# Patient Record
Sex: Male | Born: 1940 | Race: White | Hispanic: No | Marital: Married | Smoking: Former smoker
Health system: Southern US, Community
[De-identification: ages and names within clinical notes are randomized; demographics above are authoritative.]

## PROBLEM LIST (undated history)

## (undated) DIAGNOSIS — D649 Anemia, unspecified: Secondary | ICD-10-CM

## (undated) DIAGNOSIS — I10 Essential (primary) hypertension: Secondary | ICD-10-CM

## (undated) DIAGNOSIS — M1990 Unspecified osteoarthritis, unspecified site: Secondary | ICD-10-CM

## (undated) DIAGNOSIS — H269 Unspecified cataract: Secondary | ICD-10-CM

## (undated) DIAGNOSIS — R569 Unspecified convulsions: Secondary | ICD-10-CM

## (undated) DIAGNOSIS — E039 Hypothyroidism, unspecified: Secondary | ICD-10-CM

## (undated) DIAGNOSIS — G459 Transient cerebral ischemic attack, unspecified: Secondary | ICD-10-CM

## (undated) HISTORY — PX: BACK SURGERY: SHX140

## (undated) HISTORY — PX: CATARACT EXTRACTION: SUR2

---

## 2005-03-26 ENCOUNTER — Emergency Department: Payer: Self-pay | Admitting: Unknown Physician Specialty

## 2005-03-26 ENCOUNTER — Other Ambulatory Visit: Payer: Self-pay

## 2009-01-06 ENCOUNTER — Ambulatory Visit: Payer: Self-pay | Admitting: Unknown Physician Specialty

## 2009-01-16 ENCOUNTER — Ambulatory Visit: Payer: Self-pay | Admitting: Unknown Physician Specialty

## 2010-03-20 ENCOUNTER — Emergency Department: Payer: Self-pay | Admitting: Emergency Medicine

## 2010-06-05 ENCOUNTER — Emergency Department: Payer: Self-pay | Admitting: Emergency Medicine

## 2010-09-08 ENCOUNTER — Emergency Department: Payer: Self-pay | Admitting: Emergency Medicine

## 2011-01-24 ENCOUNTER — Observation Stay: Payer: Self-pay | Admitting: Family Medicine

## 2011-03-01 ENCOUNTER — Ambulatory Visit: Payer: Self-pay | Admitting: Oncology

## 2012-01-31 ENCOUNTER — Emergency Department: Payer: Self-pay | Admitting: Internal Medicine

## 2012-01-31 LAB — CBC WITH DIFFERENTIAL/PLATELET
Basophil #: 0.2 10*3/uL — ABNORMAL HIGH (ref 0.0–0.1)
Basophil %: 1.4 %
Lymphocyte %: 15.4 %
Monocyte %: 6.1 %
Platelet: 239 10*3/uL (ref 150–440)
RDW: 17.1 % — ABNORMAL HIGH (ref 11.5–14.5)
WBC: 12.2 10*3/uL — ABNORMAL HIGH (ref 3.8–10.6)

## 2012-01-31 LAB — URINALYSIS, COMPLETE
Bacteria: NONE SEEN
Bilirubin,UR: NEGATIVE
Blood: NEGATIVE
Glucose,UR: NEGATIVE mg/dL (ref 0–75)
Ketone: NEGATIVE
Leukocyte Esterase: NEGATIVE
Nitrite: NEGATIVE
Squamous Epithelial: NONE SEEN
WBC UR: NONE SEEN /HPF (ref 0–5)

## 2012-01-31 LAB — COMPREHENSIVE METABOLIC PANEL
Albumin: 3.7 g/dL (ref 3.4–5.0)
Alkaline Phosphatase: 81 U/L (ref 50–136)
BUN: 16 mg/dL (ref 7–18)
Bilirubin,Total: 0.5 mg/dL (ref 0.2–1.0)
Calcium, Total: 9.5 mg/dL (ref 8.5–10.1)
Co2: 27 mmol/L (ref 21–32)
EGFR (Non-African Amer.): 51 — ABNORMAL LOW
Glucose: 132 mg/dL — ABNORMAL HIGH (ref 65–99)
Osmolality: 273 (ref 275–301)
SGOT(AST): 11 U/L — ABNORMAL LOW (ref 15–37)
Sodium: 135 mmol/L — ABNORMAL LOW (ref 136–145)
Total Protein: 7.9 g/dL (ref 6.4–8.2)

## 2012-01-31 LAB — LIPASE, BLOOD: Lipase: 136 U/L (ref 73–393)

## 2012-10-21 ENCOUNTER — Emergency Department: Payer: Self-pay | Admitting: Emergency Medicine

## 2012-10-21 LAB — URINALYSIS, COMPLETE
Bacteria: NONE SEEN
Blood: NEGATIVE
Ketone: NEGATIVE
Leukocyte Esterase: NEGATIVE
Nitrite: NEGATIVE
Ph: 5 (ref 4.5–8.0)
Protein: NEGATIVE
RBC,UR: 1 /HPF (ref 0–5)
Specific Gravity: 1.014 (ref 1.003–1.030)
Squamous Epithelial: <1
WBC UR: <1 /HPF (ref 0–5)

## 2012-10-21 LAB — COMPREHENSIVE METABOLIC PANEL
Alkaline Phosphatase: 92 U/L (ref 50–136)
Anion Gap: 7 (ref 7–16)
BUN: 16 mg/dL (ref 7–18)
Bilirubin,Total: 0.4 mg/dL (ref 0.2–1.0)
Calcium, Total: 9.3 mg/dL (ref 8.5–10.1)
Chloride: 101 mmol/L (ref 98–107)
Co2: 25 mmol/L (ref 21–32)
Creatinine: 1.23 mg/dL (ref 0.60–1.30)
EGFR (Non-African Amer.): 59 — ABNORMAL LOW
Glucose: 117 mg/dL — ABNORMAL HIGH (ref 65–99)
Osmolality: 269 (ref 275–301)
SGOT(AST): 20 U/L (ref 15–37)
SGPT (ALT): 30 U/L (ref 12–78)
Sodium: 133 mmol/L — ABNORMAL LOW (ref 136–145)
Total Protein: 7.8 g/dL (ref 6.4–8.2)

## 2012-10-21 LAB — CBC
HCT: 35.7 % — ABNORMAL LOW (ref 40.0–52.0)
HGB: 12.5 g/dL — ABNORMAL LOW (ref 13.0–18.0)
MCH: 31.5 pg (ref 26.0–34.0)
MCV: 90 fL (ref 80–100)
RDW: 15.3 % — ABNORMAL HIGH (ref 11.5–14.5)
WBC: 11.9 10*3/uL — ABNORMAL HIGH (ref 3.8–10.6)

## 2012-10-27 ENCOUNTER — Ambulatory Visit: Payer: Self-pay | Admitting: Family Medicine

## 2013-11-07 ENCOUNTER — Emergency Department: Payer: Self-pay | Admitting: Emergency Medicine

## 2013-11-08 LAB — BASIC METABOLIC PANEL
Anion Gap: 6 — ABNORMAL LOW (ref 7–16)
BUN: 12 mg/dL (ref 7–18)
CALCIUM: 9.5 mg/dL (ref 8.5–10.1)
CHLORIDE: 91 mmol/L — AB (ref 98–107)
CO2: 31 mmol/L (ref 21–32)
Creatinine: 0.97 mg/dL (ref 0.60–1.30)
EGFR (African American): >60
EGFR (Non-African Amer.): >60
Glucose: 117 mg/dL — ABNORMAL HIGH (ref 65–99)
Osmolality: 258 (ref 275–301)
Potassium: 4.1 mmol/L (ref 3.5–5.1)
Sodium: 128 mmol/L — ABNORMAL LOW (ref 136–145)

## 2013-11-08 LAB — MAGNESIUM: MAGNESIUM: 0.8 mg/dL — AB

## 2014-03-29 ENCOUNTER — Ambulatory Visit: Payer: Self-pay

## 2014-03-30 ENCOUNTER — Observation Stay (HOSPITAL_COMMUNITY)
Admission: EM | Admit: 2014-03-30 | Discharge: 2014-04-01 | Disposition: A | Discharge status: Home / Self Care | Payer: 59 | Attending: Family Medicine | Admitting: Family Medicine

## 2014-03-30 ENCOUNTER — Emergency Department (HOSPITAL_COMMUNITY): Payer: 59

## 2014-03-30 ENCOUNTER — Encounter (HOSPITAL_COMMUNITY): Payer: Self-pay | Admitting: Emergency Medicine

## 2014-03-30 DIAGNOSIS — G459 Transient cerebral ischemic attack, unspecified: Principal | ICD-10-CM | POA: Diagnosis present

## 2014-03-30 DIAGNOSIS — E039 Hypothyroidism, unspecified: Secondary | ICD-10-CM | POA: Diagnosis not present

## 2014-03-30 DIAGNOSIS — R200 Anesthesia of skin: Secondary | ICD-10-CM | POA: Diagnosis present

## 2014-03-30 DIAGNOSIS — K219 Gastro-esophageal reflux disease without esophagitis: Secondary | ICD-10-CM | POA: Diagnosis present

## 2014-03-30 DIAGNOSIS — I10 Essential (primary) hypertension: Secondary | ICD-10-CM | POA: Diagnosis not present

## 2014-03-30 DIAGNOSIS — E871 Hypo-osmolality and hyponatremia: Secondary | ICD-10-CM | POA: Diagnosis not present

## 2014-03-30 DIAGNOSIS — Z8673 Personal history of transient ischemic attack (TIA), and cerebral infarction without residual deficits: Secondary | ICD-10-CM | POA: Diagnosis not present

## 2014-03-30 DIAGNOSIS — E785 Hyperlipidemia, unspecified: Secondary | ICD-10-CM | POA: Diagnosis not present

## 2014-03-30 DIAGNOSIS — G451 Carotid artery syndrome (hemispheric): Secondary | ICD-10-CM

## 2014-03-30 DIAGNOSIS — I639 Cerebral infarction, unspecified: Secondary | ICD-10-CM | POA: Insufficient documentation

## 2014-03-30 HISTORY — DX: Unspecified cataract: H26.9

## 2014-03-30 HISTORY — DX: Essential (primary) hypertension: I10

## 2014-03-30 HISTORY — DX: Unspecified osteoarthritis, unspecified site: M19.90

## 2014-03-30 HISTORY — DX: Hypothyroidism, unspecified: E03.9

## 2014-03-30 HISTORY — DX: Anemia, unspecified: D64.9

## 2014-03-30 HISTORY — DX: Transient cerebral ischemic attack, unspecified: G45.9

## 2014-03-30 LAB — COMPREHENSIVE METABOLIC PANEL
ALT: 38 U/L (ref 0–53)
AST: 38 U/L — AB (ref 0–37)
Albumin: 3.8 g/dL (ref 3.5–5.2)
Alkaline Phosphatase: 79 U/L (ref 39–117)
Anion gap: 10 (ref 5–15)
BUN: 15 mg/dL (ref 6–23)
CALCIUM: 9.6 mg/dL (ref 8.4–10.5)
CO2: 26 mEq/L (ref 19–32)
Chloride: 94 mEq/L — ABNORMAL LOW (ref 96–112)
Creatinine, Ser: 0.99 mg/dL (ref 0.50–1.35)
GFR calc Af Amer: >90 mL/min (ref >90)
GFR calc non Af Amer: 80 mL/min — ABNORMAL LOW (ref >90)
Glucose, Bld: 98 mg/dL (ref 70–99)
Potassium: 4.5 mEq/L (ref 3.7–5.3)
SODIUM: 130 meq/L — AB (ref 137–147)
TOTAL PROTEIN: 6.9 g/dL (ref 6.0–8.3)
Total Bilirubin: 0.2 mg/dL — ABNORMAL LOW (ref 0.3–1.2)

## 2014-03-30 LAB — CBC
HCT: 32.4 % — ABNORMAL LOW (ref 39.0–52.0)
Hemoglobin: 11.9 g/dL — ABNORMAL LOW (ref 13.0–17.0)
MCH: 31.4 pg (ref 26.0–34.0)
MCHC: 36.7 g/dL — ABNORMAL HIGH (ref 30.0–36.0)
MCV: 85.5 fL (ref 78.0–100.0)
PLATELETS: 264 10*3/uL (ref 150–400)
RBC: 3.79 MIL/uL — ABNORMAL LOW (ref 4.22–5.81)
RDW: 13.1 % (ref 11.5–15.5)
WBC: 5.5 10*3/uL (ref 4.0–10.5)

## 2014-03-30 LAB — DIFFERENTIAL
BASOS ABS: 0.1 10*3/uL (ref 0.0–0.1)
Basophils Relative: 1 % (ref 0–1)
EOS ABS: 0.2 10*3/uL (ref 0.0–0.7)
EOS PCT: 3 % (ref 0–5)
LYMPHS PCT: 27 % (ref 12–46)
Lymphs Abs: 1.5 10*3/uL (ref 0.7–4.0)
Monocytes Absolute: 0.5 10*3/uL (ref 0.1–1.0)
Monocytes Relative: 9 % (ref 3–12)
Neutro Abs: 3.3 10*3/uL (ref 1.7–7.7)
Neutrophils Relative %: 60 % (ref 43–77)

## 2014-03-30 LAB — URINALYSIS, ROUTINE W REFLEX MICROSCOPIC
Bilirubin Urine: NEGATIVE
Glucose, UA: NEGATIVE mg/dL
Hgb urine dipstick: NEGATIVE
KETONES UR: NEGATIVE mg/dL
Leukocytes, UA: NEGATIVE
NITRITE: NEGATIVE
PH: 6 (ref 5.0–8.0)
Protein, ur: NEGATIVE mg/dL
SPECIFIC GRAVITY, URINE: 1.012 (ref 1.005–1.030)
Urobilinogen, UA: 0.2 mg/dL (ref 0.0–1.0)

## 2014-03-30 LAB — I-STAT TROPONIN, ED: Troponin i, poc: 0.02 ng/mL (ref 0.00–0.08)

## 2014-03-30 LAB — APTT: aPTT: 31 seconds (ref 24–37)

## 2014-03-30 LAB — PROTIME-INR
INR: 1.03 (ref 0.00–1.49)
PROTHROMBIN TIME: 13.7 s (ref 11.6–15.2)

## 2014-03-30 MED ORDER — ACETAMINOPHEN 650 MG RE SUPP: 650.0000 mg | RECTAL | Status: DC | PRN

## 2014-03-30 MED ORDER — CLOPIDOGREL BISULFATE 75 MG PO TABS
75.0000 mg | ORAL_TABLET | Freq: Every day | ORAL | Status: DC
  Administered 2014-03-31 – 2014-04-01 (×2): 75 mg via ORAL
  Filled 2014-03-30 (×2): qty 1

## 2014-03-30 MED ORDER — ASPIRIN 81 MG PO CHEW
324.0000 mg | CHEWABLE_TABLET | Freq: Once | ORAL | Status: AC
  Administered 2014-03-30: 324 mg via ORAL
  Filled 2014-03-30: qty 4

## 2014-03-30 MED ORDER — STROKE: EARLY STAGES OF RECOVERY BOOK
Freq: Once | Status: AC
  Administered 2014-03-31: 05:00:00
  Filled 2014-03-30: qty 1

## 2014-03-30 MED ORDER — ACETAMINOPHEN 325 MG PO TABS: 650.0000 mg | ORAL_TABLET | ORAL | Status: DC | PRN

## 2014-03-30 MED ORDER — HEPARIN SODIUM (PORCINE) 5000 UNIT/ML IJ SOLN
5000.0000 [IU] | Freq: Three times a day (TID) | INTRAMUSCULAR | Status: DC
  Administered 2014-03-30 – 2014-04-01 (×6): 5000 [IU] via SUBCUTANEOUS
  Filled 2014-03-30 (×6): qty 1

## 2014-03-30 NOTE — ED Notes (Signed)
Pt taken to CT.

## 2014-03-30 NOTE — ED Notes (Signed)
Pt arrives via [facility] from home. States at 2010 pt began experiencing slurred speech, confusion and rt sided weakness. By the time EMS arrived all symptoms had resolved. Pt had confirmed TIA on Friday at [place]. Pt was started on ASA and Plavix.

## 2014-03-30 NOTE — ED Notes (Signed)
Dr. Ward at bedside.

## 2014-03-30 NOTE — H&P (Signed)
[facility] History and Physical  Gregory Weaver YQM:[id number] DOB: 04/10/1941 DOA: 03/30/2014  Referring physician: EDP PCP: No primary provider on file.   Chief Complaint: R sided weakness and aphasia   HPI: Gregory Weaver is a 73 y.o. male h/o HTN, hypothyroidism, prior TIA.  Patient presents to the ED with episode of L hand numbness yesterday that lasted for approximately 10 mins.  [facility] and was started on plavix which he took yesterday but has not taken today.  Just PTA he had an episode of R hand numbness, R facial droop, and aphasia that lasted for 30 mins and resolved prior to evaluation in the ED.  He is now at baseline and symptom free he states.  Denies headache or head injury.  Review of Systems: Systems reviewed.  As above, otherwise negative  Past Medical History  Diagnosis Date  . TIA (transient ischemic attack)   . Hypertension   . Hypothyroid   . Cataract   . Anemia   . Arthritis    Past Surgical History  Procedure Laterality Date  . Cataract extraction     Social History:  reports that he has quit smoking. He has never used smokeless tobacco. He reports that he does not drink alcohol or use illicit drugs.  Allergies  Allergen Reactions  . Codeine Nausea And Vomiting  . Guaifenesin & Derivatives     Chest pain    No family history on file.   Prior to Admission medications   Not on File   Physical Exam: Filed Vitals:   03/30/14 2315  BP: 168/58  Pulse: 64  Temp:   Resp: 18    BP 168/58  Pulse 64  Temp(Src) 98 F (36.7 C) (Oral)  Resp 18  Ht 5\' 7"  (1.702 m)  Wt 69.854 kg (154 lb)  BMI 24.11 kg/m2  SpO2 99%  General Appearance:    Alert, oriented, no distress, appears stated age  Head:    Normocephalic, atraumatic  Eyes:    PERRL, EOMI, sclera non-icteric        Nose:   Nares without drainage or epistaxis. Mucosa, turbinates normal  Throat:   Moist mucous membranes. Oropharynx  without erythema or exudate.  Neck:   Supple. No carotid bruits.  No thyromegaly.  No lymphadenopathy.   Back:     No CVA tenderness, no spinal tenderness  Lungs:     Clear to auscultation bilaterally, without wheezes, rhonchi or rales  Chest wall:    No tenderness to palpitation  Heart:    Regular rate and rhythm without murmurs, gallops, rubs  Abdomen:     Soft, non-tender, nondistended, normal bowel sounds, no organomegaly  Genitalia:    deferred  Rectal:    deferred  Extremities:   No clubbing, cyanosis or edema.  Pulses:   2+ and symmetric all extremities  Skin:   Skin color, texture, turgor normal, no rashes or lesions  Lymph nodes:   Cervical, supraclavicular, and axillary nodes normal  Neurologic:   CNII-XII intact. Normal strength, sensation and reflexes      throughout    Labs on Admission:  Basic Metabolic Panel:  Recent Labs Lab 03/30/14 2159  NA 130*  K 4.5  CL 94*  CO2 26  GLUCOSE 98  BUN 15  CREATININE 0.99  CALCIUM 9.6   Liver Function Tests:  Recent Labs Lab 03/30/14 2159  AST 38*  ALT 38  ALKPHOS 79  BILITOT  0.2*  PROT 6.9  ALBUMIN 3.8   No results found for this basename: LIPASE, AMYLASE,  in the last 168 hours No results found for this basename: AMMONIA,  in the last 168 hours CBC:  Recent Labs Lab 03/30/14 2159  WBC 5.5  NEUTROABS 3.3  HGB 11.9*  HCT 32.4*  MCV 85.5  PLT 264   Cardiac Enzymes: No results found for this basename: CKTOTAL, CKMB, CKMBINDEX, TROPONINI,  in the last 168 hours  BNP (last 3 results) No results found for this basename: PROBNP,  in the last 8760 hours CBG: No results found for this basename: GLUCAP,  in the last 168 hours  Radiological Exams on Admission: Ct Head Wo Contrast  03/30/2014   CLINICAL DATA:  Slurred speech, confusion, and right-sided weakness. Recent transient ischemic attack.  EXAM: CT HEAD WITHOUT CONTRAST  TECHNIQUE: Contiguous axial images were obtained from the base of the skull  through the vertex without intravenous contrast.  COMPARISON:  None.  FINDINGS: No mass lesion. No midline shift. No acute hemorrhage or hematoma. No extra-axial fluid collections. No evidence of acute infarction. There is slight periventricular white matter lucency consistent with chronic small vessel ischemic disease. Slight prominence of the lateral ventricles.  No osseous abnormality.  IMPRESSION: No acute abnormality. Minimal chronic small vessel ischemic disease.   Electronically Signed   By: Geanie CooleyJim  Maxwell M.D.   On: 03/30/2014 22:42    EKG: Independently reviewed.  Assessment/Plan Principal Problem:   TIA (transient ischemic attack)   1. TIA - 1. MRI/MRA brain 2. 2d echo, carotid duplex 3. A1C and lipid profile 4. Neuro consulted 5. Will keep patient on plavix (since his PCP started this) until neuro has a chance to evaluate him and make formal antiplatelet recs.  Got 1 dose of ASA 325 in ED today. 6. Tele monitor 2. HTN - BP currently running in the 160s-180s in the ED, asymptomatic currently but I hesitate to treat in setting of what sounds like acute TIA, monitor BPs and treat if SBP becomes higher than 200. 1. Long term will need to be started on treatment for HTN most likely (he says it usually runs around 140 at home).  Dr. Thad Rangereynolds called by EDP.  Code Status: Full Code  Family Communication: Daughter at bedside Disposition Plan: Admit to obs   Time spent: 70 min  Gregory Weaver M. [facility] Pager [phone]  If 7AM-7PM, please contact the day team taking care of the patient [email or web address] Password [email or web address] 03/30/2014, 11:40 PM

## 2014-03-30 NOTE — ED Provider Notes (Signed)
TIME SEEN: 9:40 PM  CHIEF COMPLAINT: Right hand numbness, right facial droop, aphasia  HPI: Patient is a 73 year old male with history of hypertension, hypothyroidism, prior TIA who presents to the emergency department with an episode of left hand numbness yesterday that lasted for approximately 10 minutes. He [facility] and was started on Plavix which he took yesterday but has not taken today. Just prior to arrival he had an episode of right hand numbness, right facial droop and aphasia that lasted for 30 minutes and resolved prior to me seeing the patient. He denies any headache or head injury. Denies any weakness. Denies any chest pain or shortness of breath. No palpitations.  ROS: See HPI Constitutional: no fever  Eyes: no drainage  ENT: no runny nose   Cardiovascular:  no chest pain  Resp: no SOB  GI: no vomiting GU: no dysuria Integumentary: no rash  Allergy: no hives  Musculoskeletal: no leg swelling  Neurological: no slurred speech ROS otherwise negative  PAST MEDICAL HISTORY/PAST SURGICAL HISTORY:  History reviewed. No pertinent past medical history.  MEDICATIONS:  Prior to Admission medications   Not on File    ALLERGIES:  Allergies not on file  SOCIAL HISTORY:  History  Substance Use Topics  . Smoking status: Not on file  . Smokeless tobacco: Not on file  . Alcohol Use: Not on file    FAMILY HISTORY: No family history on file.  EXAM: BP 183/58  Pulse 69  Temp(Src) 98 F (36.7 C) (Oral)  Resp 21  Ht 5\' 7"  (1.702 m)  Wt 154 lb (69.854 kg)  BMI 24.11 kg/m2  SpO2 100% CONSTITUTIONAL: Alert and oriented and responds appropriately to questions. Well-appearing; well-nourished HEAD: Normocephalic EYES: Conjunctivae clear, PERRL ENT: normal nose; no rhinorrhea; moist mucous membranes; pharynx without lesions noted NECK: Supple, no meningismus, no LAD  CARD: RRR; S1 and S2 appreciated; no murmurs, no  clicks, no rubs, no gallops RESP: Normal chest excursion without splinting or tachypnea; breath sounds clear and equal bilaterally; no wheezes, no rhonchi, no rales,  ABD/GI: Normal bowel sounds; non-distended; soft, non-tender, no rebound, no guarding BACK:  The back appears normal and is non-tender to palpation, there is no CVA tenderness EXT: Normal ROM in all joints; non-tender to palpation; no edema; normal capillary refill; no cyanosis    SKIN: Normal color for age and race; warm NEURO: Moves all extremities equally; strabismus, sensation to light touch intact diffusely, cranial nerves II through XII intact, and the NIH stroke scale is 0 PSYCH: The patient's mood and manner are appropriate. Grooming and personal hygiene are appropriate.  MEDICAL DECISION MAKING: Patient here with symptoms concerning for a TIA. He has NIH scale is 0. He is now his neurologic baseline. He had similar symptoms yesterday that resolved after 10 minutes it was started on Plavix but did not take his dose tonight. He reports he has had a prior TIA in the past and was on aspirin. He did not have a stroke workup yesterday as an outpatient. He is here today because he had increasing symptoms that lasted longer. We'll obtain today workup including labs, urine, head CT. Anticipate discussing case with neurology.  ED PROGRESS: Patient's workup has been unremarkable. EKG shows no arrhythmia. Head CT shows chronic small vessel ischemic disease. Will discuss with neuro hospitalist.   11:04 PM  Discussed with Dr. Thad Rangereynolds who agrees with medicine admission for TIA workup.  11:19 PM  Discussed with  Dr. Julian ReilGardner for admission to tele, obs.     EKG Interpretation  Date/Time:  Saturday March 30 2014 21:34:11 EDT Ventricular Rate:  67 PR Interval:  155 QRS Duration: 90 QT Interval:  381 QTC Calculation: 402 R Axis:   -31 Text Interpretation:  Sinus rhythm Left axis deviation Abnormal R-wave progression, early transition  Confirmed by Girl Schissler,  DO, Hibo Blasdell [phone]([id number]) on 03/30/2014 9:38:32 PM        Layla MawKristen N Keion Neels, DO 03/30/14 2319

## 2014-03-30 NOTE — ED Notes (Signed)
Hospitalist consult at bedside. 

## 2014-03-30 NOTE — ED Notes (Signed)
Dr Elesa MassedWard made aware of symptoms.

## 2014-03-31 ENCOUNTER — Observation Stay (HOSPITAL_COMMUNITY): Payer: 59

## 2014-03-31 DIAGNOSIS — G452 Multiple and bilateral precerebral artery syndromes: Secondary | ICD-10-CM

## 2014-03-31 DIAGNOSIS — G459 Transient cerebral ischemic attack, unspecified: Secondary | ICD-10-CM | POA: Diagnosis not present

## 2014-03-31 LAB — HEMOGLOBIN A1C
Hgb A1c MFr Bld: 5.6 % (ref <5.7)
MEAN PLASMA GLUCOSE: 114 mg/dL (ref <117)

## 2014-03-31 LAB — LIPID PANEL
Cholesterol: 161 mg/dL (ref 0–200)
HDL: 34 mg/dL — ABNORMAL LOW (ref >39)
LDL Cholesterol: 99 mg/dL (ref 0–99)
Total CHOL/HDL Ratio: 4.7 RATIO
Triglycerides: 141 mg/dL (ref <150)
VLDL: 28 mg/dL (ref 0–40)

## 2014-03-31 MED ORDER — ATORVASTATIN CALCIUM 10 MG PO TABS
20.0000 mg | ORAL_TABLET | Freq: Every day | ORAL | Status: DC
  Filled 2014-03-31: qty 2

## 2014-03-31 MED ORDER — FLUTICASONE PROPIONATE 50 MCG/ACT NA SUSP
2.0000 | Freq: Every day | NASAL | Status: DC | PRN
  Administered 2014-03-31: 2 via NASAL
  Filled 2014-03-31 (×2): qty 16

## 2014-03-31 MED ORDER — LORAZEPAM 2 MG/ML IJ SOLN
1.0000 mg | Freq: Once | INTRAMUSCULAR | Status: AC | PRN
  Administered 2014-03-31: 1 mg via INTRAVENOUS
  Filled 2014-03-31: qty 1

## 2014-03-31 NOTE — Progress Notes (Addendum)
SLP Cancellation Note  Patient Details Name: Gregory Weaver MRN: [id number] DOB: [birth year]   Cancelled treatment:         Pt. Getting ready to leave for MRI.  Will return at first available chance for speech-language-cognitive eval.  [name], [name] 03/31/2014, 11:40 AM  Pager [phone]

## 2014-03-31 NOTE — Progress Notes (Signed)
[facility] PROGRESS NOTE  Balen Woolum ZOX:[id number] DOB: [birth year] DOA: 03/30/2014 PCP: No primary care provider on file.  Assessment/Plan: Principal Problem:   TIA (transient ischemic attack) - Neurology assisting with case - currently undergoing routine work up - MR of brain reported as negative for acute infarct, MRI negative for cerebral aneurysm - CT of head: no acute abnormality reported.  Code Status: full Family Communication: discussed with wife and patient at bedside Disposition Plan: pending workup   Consultants:  neurology  Procedures:  As listed above  Echocardiogram pending  Carotid Dopplers  Antibiotics:  None  HPI/Subjective: Pt has no new complaints. No acute issues overnight.  Objective: Filed Vitals:   03/31/14 1345  BP: 175/53  Pulse: 72  Temp: 97.8 F (36.6 C)  Resp: 20    Intake/Output Summary (Last 24 hours) at 03/31/14 1428 Last data filed at 03/31/14 0914  Gross per 24 hour  Intake    360 ml  Output      0 ml  Net    360 ml   Filed Weights   03/30/14 2136  Weight: 69.854 kg (154 lb)    Exam:   General:  Pt in nad, alert and awake  Cardiovascular: rrr, no mrg  Respiratory: cta bl, no wheezes  Abdomen: soft, NT, ND  Musculoskeletal: no cyanosis or clubbing   Data Reviewed: Basic Metabolic Panel:  Recent Labs Lab 03/30/14 2159  NA 130*  K 4.5  CL 94*  CO2 26  GLUCOSE 98  BUN 15  CREATININE 0.99  CALCIUM 9.6   Liver Function Tests:  Recent Labs Lab 03/30/14 2159  AST 38*  ALT 38  ALKPHOS 79  BILITOT 0.2*  PROT 6.9  ALBUMIN 3.8   No results for input(s): LIPASE, AMYLASE in the last 168 hours. No results for input(s): AMMONIA in the last 168 hours. CBC:  Recent Labs Lab 03/30/14 2159  WBC 5.5  NEUTROABS 3.3  HGB 11.9*  HCT 32.4*  MCV 85.5  PLT 264   Cardiac Enzymes: No results for input(s): CKTOTAL, CKMB, CKMBINDEX, TROPONINI in the last 168 hours. BNP (last 3  results) No results for input(s): PROBNP in the last 8760 hours. CBG: No results for input(s): GLUCAP in the last 168 hours.  No results found for this or any previous visit (from the past 240 hour(s)).   Studies: Ct Head Wo Contrast  03/30/2014   CLINICAL DATA:  Slurred speech, confusion, and right-sided weakness. Recent transient ischemic attack.  EXAM: CT HEAD WITHOUT CONTRAST  TECHNIQUE: Contiguous axial images were obtained from the base of the skull through the vertex without intravenous contrast.  COMPARISON:  None.  FINDINGS: No mass lesion. No midline shift. No acute hemorrhage or hematoma. No extra-axial fluid collections. No evidence of acute infarction. There is slight periventricular white matter lucency consistent with chronic small vessel ischemic disease. Slight prominence of the lateral ventricles.  No osseous abnormality.  IMPRESSION: No acute abnormality. Minimal chronic small vessel ischemic disease.   Electronically Signed   By: Geanie Cooley M.D.   On: 03/30/2014 22:42   Mr Brain Wo Contrast  03/31/2014   CLINICAL DATA:  Stroke. Left hand numbness has resolved. Also slurred speech.  EXAM: MRI HEAD WITHOUT CONTRAST  MRA HEAD WITHOUT CONTRAST  TECHNIQUE: Multiplanar, multiecho pulse sequences of the brain and surrounding structures were obtained without intravenous contrast. Angiographic images of the head were obtained using MRA technique without contrast.  COMPARISON:  CT head 03/30/2014  FINDINGS: MRI  HEAD FINDINGS  Mild atrophy. Ventricle size is mildly enlarged consistent with the level of atrophy.  Os odontoideum versus chronic fracture of the dens with posterior displacement of the dens by approximately 10 mm. This is causing impingement of the spinal cord at the craniocervical junction. 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 Further evaluation with cervical MRI and correlation with any prior history is suggested. No acute bone marrow edema is present and this appears chronic.  Negative  for acute infarct.  Chronic microvascular ischemic changes in the white matter. 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  Chronic microhemorrhage right brachium pontis. Cluster of chronic micro hemorrhage in the right parietal lobe.  Negative for mass or edema.  No shift of the midline structures.  Paranasal sinuses are clear.  MRA HEAD FINDINGS  Both vertebral arteries are patent to the basilar without significant stenosis. PICA patent bilaterally. Basilar widely patent. AICA, superior cerebellar, posterior cerebral arteries are normal  Internal carotid artery normal bilaterally. Variant anterior cerebral artery anatomy. 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  Moderate stenosis of the right M1 segment. Right middle cerebral artery branches are patent. Left middle cerebral artery widely patent  Negative for cerebral aneurysm.  IMPRESSION: Negative for acute infarct.  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 MRI cervical spine recommended for further evaluation as well as correlation with any history of fracture or prior imaging studies.  Moderate stenosis right M1 segment. No other significant intracranial stenosis.   Electronically Signed   By: Marlan Palauharles  Clark M.D.   On: 03/31/2014 13:59   Mr Maxine GlennMra Head/brain Wo Cm  03/31/2014   CLINICAL DATA:  Stroke. Left hand numbness has resolved. Also slurred speech.  EXAM: MRI HEAD WITHOUT CONTRAST  MRA HEAD WITHOUT CONTRAST  TECHNIQUE: Multiplanar, multiecho pulse sequences of the brain and surrounding structures were obtained without intravenous contrast. Angiographic images of the head were obtained using MRA technique without contrast.  COMPARISON:  CT head 03/30/2014  FINDINGS: MRI HEAD FINDINGS  Mild atrophy. Ventricle size is mildly enlarged consistent with the level of  atrophy.  Os odontoideum versus chronic fracture of the dens with posterior displacement of the dens by approximately 10 mm. This is causing impingement of the spinal cord at the craniocervical junction. 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 Further evaluation with cervical MRI and correlation with any prior history is suggested. No acute bone marrow edema is present and this appears chronic.  Negative for acute infarct.  Chronic microvascular ischemic changes in the white matter. 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  Chronic microhemorrhage right brachium pontis. Cluster of chronic micro hemorrhage in the right parietal lobe.  Negative for mass or edema.  No shift of the midline structures.  Paranasal sinuses are clear.  MRA HEAD FINDINGS  Both vertebral arteries are patent to the basilar without significant stenosis. PICA patent bilaterally. Basilar widely patent. AICA, superior cerebellar, posterior cerebral arteries are normal  Internal carotid artery normal bilaterally. Variant anterior cerebral artery anatomy. 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  Moderate stenosis of the right M1 segment. Right middle cerebral artery branches are patent. Left middle cerebral artery widely patent  Negative for cerebral aneurysm.  IMPRESSION: Negative for acute infarct.  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 MRI cervical spine recommended for further evaluation as well as correlation with any history of fracture or prior imaging studies.  Moderate stenosis right M1 segment. No other significant intracranial stenosis.   Electronically Signed   By: Marlan Palauharles  Clark M.D.   On: 03/31/2014 13:59    Scheduled Meds: . clopidogrel  75 mg Oral Daily  . heparin  5,000 Units Subcutaneous 3 times per day   Continuous Infusions:    Time spent: > 35 minutes    Penny PiaVEGA, Azhar Knope  [facility] Pager [phone] If 7PM-7AM, please contact night-coverage at [email or web address], password [email or web address] 03/31/2014, 2:28 PM  LOS: 1 day

## 2014-03-31 NOTE — Evaluation (Signed)
Physical Therapy Evaluation Patient Details Name: Nani GasserWalter Ray Soots MRN: [id number] DOB: 07/25/1940 Today's Date: 03/31/2014   History of Present Illness  Patient is a 73 yo male admitted 03/30/14 with RUE weakness and aphasia to r/o CVA/TIA.  PMH: HTN, TIA, anemia, arthritis, h/o back injury with Lt drop foot  Clinical Impression  Patient presents with problems listed below.  Patient medicated for MRI today and remains "groggy" from meds, impacting balance and mobility.  Patient will benefit from acute PT to address balance/mobility prior to return home with wife.    Follow Up Recommendations No PT follow up;Supervision/Assistance - 24 hour    Equipment Recommendations  None recommended by PT    Recommendations for Other Services       Precautions / Restrictions Precautions Precautions: Fall Precaution Comments: Lt foot drop.  Patient wears shoes any time up. Restrictions Weight Bearing Restrictions: No      Mobility  Bed Mobility Overal bed mobility: Modified Independent             General bed mobility comments: Use of bedrail to move to sitting.  Transfers Overall transfer level: Needs assistance Equipment used: None Transfers: Sit to/from Stand Sit to Stand: Min assist         General transfer comment: Verbal cues for technique.  Assist for balance during transfer and once standing.  Unsteady.  Patient reports feeling " 'lightheaded' from medicine"  Ambulation/Gait Ambulation/Gait assistance: Min assist Ambulation Distance (Feet): 120 Feet Assistive device: None Gait Pattern/deviations: Step-through pattern;Decreased step length - right;Decreased step length - left;Decreased dorsiflexion - left;Steppage;Decreased weight shift to left;Staggering left;Staggering right Gait velocity: Decreased Gait velocity interpretation: Below normal speed for age/gender General Gait Details: Patient with steppage gait due to Lt drop foot.  Decreased balance with gait,  requiring assist for balance x2.  Encouraged patient to slow gait speed for balance/safety.  Stairs            Wheelchair Mobility    Modified Rankin (Stroke Patients Only) Modified Rankin (Stroke Patients Only) Pre-Morbid Rankin Score: Slight disability Modified Rankin: Moderately severe disability (Balance decreased possibly due to medication)     Balance Overall balance assessment: Needs assistance Sitting-balance support: No upper extremity supported;Feet supported Sitting balance-Leahy Scale: Good Sitting balance - Comments: Patient able to lean forward and then bring LE's up to don shoes while maintaining balance.  While sitting EOB answering questions, patient began leaning posteriorly (groggy and losing balance). Postural control: Posterior lean Standing balance support: No upper extremity supported Standing balance-Leahy Scale: Fair Standing balance comment: Able to maintain static balance.  Slightly unsteady with dynamic activities.                             Pertinent Vitals/Pain Pain Assessment: No/denies pain    Home Living Family/patient expects to be discharged to:: Private residence Living Arrangements: Spouse/significant other Available Help at Discharge: Family;Available 24 hours/day Type of Home: House Home Access: Stairs to enter Entrance Stairs-Rails: Doctor, general practiceight;Left Entrance Stairs-Number of Steps: 6 Home Layout: One level Home Equipment: Electric scooter (Forearm crutches)      Prior Function Level of Independence: Independent with assistive device(s);Needs assistance   Gait / Transfers Assistance Needed: Has assist on stairs.  Uses scooter for longer distances outside of house.           Hand Dominance        Extremity/Trunk Assessment   Upper Extremity Assessment: Overall WFL for tasks assessed  Lower Extremity Assessment: Overall WFL for tasks assessed;LLE deficits/detail   LLE Deficits / Details: Noted LLE  shorter than RLE, and noted decreased dorsiflexion at 1/5.  Unable to passively move Lt ankle to neutral (drop foot).     Communication   Communication: Expressive difficulties (Slurred speech noted - pt and wife report this is baseline)  Cognition Arousal/Alertness: Lethargic;Suspect due to medications (Ativan for MRI) Behavior During Therapy: WFL for tasks assessed/performed Overall Cognitive Status: Within Functional Limits for tasks assessed                      General Comments      Exercises        Assessment/Plan    PT Assessment Patient needs continued PT services  PT Diagnosis Abnormality of gait   PT Problem List Decreased strength;Decreased range of motion;Decreased activity tolerance;Decreased balance;Decreased mobility  PT Treatment Interventions DME instruction;Gait training;Stair training;Functional mobility training;Therapeutic activities;Therapeutic exercise;Balance training;Patient/family education   PT Goals (Current goals can be found in the Care Plan section) Acute Rehab PT Goals Patient Stated Goal: To go home PT Goal Formulation: With patient/family Time For Goal Achievement: 04/07/14 Potential to Achieve Goals: Good    Frequency Min 3X/week   Barriers to discharge        Co-evaluation               End of Session Equipment Utilized During Treatment: Gait belt Activity Tolerance: Patient tolerated treatment well Patient left: in bed;with call bell/phone within reach;with family/visitor present Nurse Communication: Mobility status    Functional Assessment Tool Used: Clinical judgement Functional Limitation: Mobility: Walking and moving around Mobility: Walking and Moving Around Current Status [phone]([id number]): At least 1 percent but less than 20 percent impaired, limited or restricted Mobility: Walking and Moving Around Goal Status [phone]([id number]): At least 1 percent but less than 20 percent impaired, limited or restricted    Time: 1456-1519 PT  Time Calculation (min): 23 min   Charges:   PT Evaluation $Initial PT Evaluation Tier I: 1 Procedure PT Treatments $Gait Training: 8-22 mins   PT G Codes:   Functional Assessment Tool Used: Clinical judgement Functional Limitation: Mobility: Walking and moving around    Vena AustriaDavis, Joshue Badal H 03/31/2014, 4:00 PM Durenda HurtSusan H. Renaldo Fiddleravis, PT, [facility] Center[facility] Acute Rehab Services Pager [phone]

## 2014-03-31 NOTE — Progress Notes (Signed)
Utilization review completed.  

## 2014-03-31 NOTE — Progress Notes (Signed)
Speech Language Pathology  Patient Details Name: Gregory Weaver MRN: [id number] DOB: 27-May-1941 Today's Date: 03/31/2014 Time:  -    PT reported pt. sleepy following sedated MRI today.  Pt. would benefit from defer speech-language-cognition assessment when more alert for best results. Will be completed at first available opportunity.  Breck CoonsLisa Willis Mount OliveLitaker M.Ed [employer]CCC-SLP Pager [phone]

## 2014-03-31 NOTE — Progress Notes (Signed)
STROKE TEAM PROGRESS NOTE   HISTORY Gregory Weaver is an 73 y.o. male who reports that he was in his usual state o health until 1230 on Friday. At that time he had a 10 minute episode of left hand numbness. This resolved spontaneously. Then on Saturday the patient had an episode of slurred speech, right hand numbness and right facial droop with slurring that lasted about 30 minutes. Patient presented for evaluation at that time. Patient is currently at baseline. Prior to these events the patient was on 162mg  of ASA daily. He saw his PCP on Friday and Plavix was added.  Patient had a previous TIA many years ago.   Date last known well: Date: 03/29/2014 Time last known well: Time: 18:00 tPA Given: No: Resolution of symptoms   SUBJECTIVE (INTERVAL HISTORY) The patient seems to me to have mild dysarthria. The daughter reports that he was given Ativan for the MRI and still has effects from this. The patient reports that he is doing well and has not had any recurrent symptoms. He did have 2 episodes of unusual numbness. The initial episode occurred about 2 days ago involving the left upper extremity strengthening for the neck and radiating to the hand. Last about 10 minutes. He had the second event the following day but this time involving the right side and associated with significant dysarthria. That event lasted for about 30 minutes. Patient complains of having congestion which apparently has not been responsive to Flonase. He also reports having left thoracic congestion. This will be related to the hospitalist.   OBJECTIVE Temp:  [98 F (36.7 C)] 98 F (36.7 C) (10/31 2136) Pulse Rate:  [63-86] 73 (11/01 0811) Cardiac Rhythm:  [-] Normal sinus rhythm (11/01 0030) Resp:  [18-21] 20 (11/01 0811) BP: (152-185)/(50-75) 163/53 mmHg (11/01 0811) SpO2:  [98 %-100 %] 98 % (11/01 0811) Weight:  [154 lb (69.854 kg)] 154 lb (69.854 kg) (10/31 2136)  No results for input(s): GLUCAP in the  last 168 hours.  Recent Labs Lab 03/30/14 2159  NA 130*  K 4.5  CL 94*  CO2 26  GLUCOSE 98  BUN 15  CREATININE 0.99  CALCIUM 9.6    Recent Labs Lab 03/30/14 2159  AST 38*  ALT 38  ALKPHOS 79  BILITOT 0.2*  PROT 6.9  ALBUMIN 3.8    Recent Labs Lab 03/30/14 2159  WBC 5.5  NEUTROABS 3.3  HGB 11.9*  HCT 32.4*  MCV 85.5  PLT 264   No results for input(s): CKTOTAL, CKMB, CKMBINDEX, TROPONINI in the last 168 hours.  Recent Labs  03/30/14 2159  LABPROT 13.7  INR 1.03    Recent Labs  03/30/14 2213  COLORURINE YELLOW  LABSPEC 1.012  PHURINE 6.0  GLUCOSEU NEGATIVE  HGBUR NEGATIVE  BILIRUBINUR NEGATIVE  KETONESUR NEGATIVE  PROTEINUR NEGATIVE  UROBILINOGEN 0.2  NITRITE NEGATIVE  LEUKOCYTESUR NEGATIVE       Component Value Date/Time   CHOL 161 03/31/2014 0715   TRIG 141 03/31/2014 0715   HDL 34* 03/31/2014 0715   CHOLHDL 4.7 03/31/2014 0715   VLDL 28 03/31/2014 0715   LDLCALC 99 03/31/2014 0715   No results found for: HGBA1C No results found for: LABOPIA, COCAINSCRNUR, LABBENZ, AMPHETMU, THCU, LABBARB  No results for input(s): ETH in the last 168 hours.  Ct Head Wo Contrast 03/30/2014    No acute abnormality. Minimal chronic small vessel ischemic disease.     MRI / MRA 03/31/2014 Negative for acute infarct. Chronic fracture the  dens versus os odontoideum. 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  MRI cervical spine recommended for further evaluation as well as correlation with any history of fracture or prior imaging studies. Moderate stenosis right M1 segment. No other significant intracranial stenosis.   PHYSICAL EXAM GENERAL: Thin pleasant and in no acute distress.  HEENT: Supple. Atraumatic normocephalic.   ABDOMEN: soft  EXTREMITIES: No edema   BACK: Normal.  SKIN: Normal by inspection.    MENTAL STATUS: Alert and oriented. Speech- mild dysarthria; language and cognition are  generally intact. Judgment and insight normal.   CRANIAL NERVES: Pupils are equal, round and reactive to light and accommodation; extra ocular movements are full, there is no significant nystagmus; visual fields are full; upper and lower facial muscles are normal in strength and symmetric, there is no flattening of the nasolabial folds; tongue is midline; uvula is midline; shoulder elevation is normal.  MOTOR: Normal tone, bulk and strength; no pronator drift.  COORDINATION: Left finger to nose is normal, right finger to nose is normal, No rest tremor; no intention tremor; no postural tremor; no bradykinesia.   SENSATION: Normal to light touch.  ASSESSMENT/PLAN Mr. Gregory Gregory Weaver is a 73 y.o. male with history of  hypertension, hypothyroidism, and a prior TIA presenting with transient left hand numbness, right hand numbness, slurred speech, and facial droop. He did not receive IV t-PA as his deficits had resolved.  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  Carotid Doppler  pending  2D Echo  pending  LDL 99  HgbA1c pending  Subcutaneous heparin for VTE prophylaxis  Diet Heart diet with thin liquids  Activity as tolerated  aspirin 81 mg orally every day and clopidogrel 75 mg orally every day prior to admission, now on aspirin 325 mg orally every day and clopidogrel 75 mg orally every day ( Plavix was just started Friday by his primary care physician)  Patient counseled to be compliant with his antithrombotic medications  Risk factor education  Ongoing aggressive risk factor management  Resultant - resolution of deficits  Therapy recommendations:  pending  Disposition:  pending  Hypertension  Home meds: no antihypertensive medications prior to admission  Systolic blood pressure is high at times.  May need better blood pressure control although moderate stenosis of the right M1  segment  Hyperlipidemia  Home meds:  No lipid lowering medications prior to admission.  LDL 99 goal < 70  Add Lipitor 20 mg daily  Continue statin at discharge   Other Stroke Risk Factors Advanced age . Hx stroke/TIA   Other Active Problems  Mild anemia  Mild hyponatremia  Mildly elevated liver function tests  Other Pertinent History  Previous TIAs  Hospital day # 1 Delton Seeavid Rinehuls PA-C [facility] Pager [phone] 03/31/2014, 8:16 AM  The patient was seen and examined by me; notes, chart and tests reviewed and discussed with midlevel provider, other providers, patient, and family.      To contact Stroke Continuity provider, please refer to [email or web address]. After hours, contact General Neurology

## 2014-03-31 NOTE — Consult Note (Signed)
Referring Physician: Julian Reil    Chief Complaint: TIA  HPI: Gregory Weaver is an 73 y.o. male who reports that he was in his usual state o health until 1230 on Friday.  At that time he had a 10 minute episode of left hand numbness.  This resolved spontaneously.  Then on Saturday the patient had an episode of slurred speech, right hand numbness and right facial droop with slurring that lasted about 30 minutes.  Patient presented for evaluation at that time.  Patient is currently at baseline.  Prioe to these events the patient was on 162mg  of ASA daily.  He saw his PCP on Friday and Plavix was added.   Patient had a previous TIA many years ago.   Date last known well: Date: 03/29/2014 Time last known well: Time: 18:00 tPA Given: No: Resolution of symptoms  Past Medical History  Diagnosis Date  . TIA (transient ischemic attack)   . Hypertension   . Hypothyroid   . Cataract   . Anemia   . Arthritis     Past Surgical History  Procedure Laterality Date  . Cataract extraction      Family history: Father with CHF and stroke.  Died of old age.   Mother died of breast cancer.  Social History:  reports that he has quit smoking. He has never used smokeless tobacco. He reports that he does not drink alcohol or use illicit drugs.  Allergies:  Allergies  Allergen Reactions  . Codeine Nausea And Vomiting  . Guaifenesin & Derivatives     Chest pain    Medications:  I have reviewed the patient's current medications. Prior to Admission:  Prescriptions prior to admission  Medication Sig Dispense Refill Last Dose  . acetaminophen (TYLENOL) 500 MG tablet Take 500 mg by mouth every 6 (six) hours as needed for mild pain.   03/30/2014 at Unknown time  . aspirin EC 81 MG tablet Take 81 mg by mouth at bedtime.   03/30/2014 at Unknown time  . Cholecalciferol (VITAMIN D-3) 1000 UNITS CAPS Take 1 capsule by mouth daily.   03/30/2014 at Unknown time  . clopidogrel (PLAVIX) 75 MG tablet Take 75 mg  by mouth daily.   03/29/2014 at Unknown time  . ferrous sulfate 325 (65 FE) MG tablet Take 325 mg by mouth daily with lunch.   03/30/2014 at Unknown time  . fluticasone (FLONASE) 50 MCG/ACT nasal spray Place 2 sprays into both nostrils daily as needed for allergies or rhinitis.   03/30/2014 at Unknown time  . folic acid (FOLVITE) 1 MG tablet Take 1 mg by mouth daily.   03/30/2014 at Unknown time  . levothyroxine (SYNTHROID, LEVOTHROID) 137 MCG tablet Take 137 mcg by mouth daily before breakfast.   03/30/2014 at Unknown time  . methotrexate (RHEUMATREX) 2.5 MG tablet Take 10 mg by mouth once a week. Caution:Chemotherapy. Protect from light. Every Thursday   03/28/2014  . Omega-3 Fatty Acids (SALMON OIL-1000 PO) Take 2 capsules by mouth 2 (two) times daily.   03/30/2014 at Unknown time  . omeprazole (PRILOSEC) 20 MG capsule Take 20 mg by mouth daily.   03/30/2014 at Unknown time  . vitamin B-12 (CYANOCOBALAMIN) 1000 MCG tablet Take 1,000 mcg by mouth daily.   03/30/2014 at Unknown time   Scheduled: .  stroke: mapping our early stages of recovery book   Does not apply Once  . clopidogrel  75 mg Oral Daily  . heparin  5,000 Units Subcutaneous 3 times per day  ROS: History obtained from the patient  General ROS: negative for - chills, fatigue, fever, night sweats, weight gain or weight loss Psychological ROS: negative for - behavioral disorder, hallucinations, memory difficulties, mood swings or suicidal ideation Ophthalmic ROS: negative for - blurry vision, double vision, eye pain or loss of vision ENT ROS: negative for - epistaxis, nasal discharge, oral lesions, sore throat, tinnitus or vertigo Allergy and Immunology ROS: negative for - hives or itchy/watery eyes Hematological and Lymphatic ROS: negative for - bleeding problems, bruising or swollen lymph nodes Endocrine ROS: negative for - galactorrhea, hair pattern changes, polydipsia/polyuria or temperature intolerance Respiratory ROS:  negative for - cough, hemoptysis, shortness of breath or wheezing Cardiovascular ROS: negative for - chest pain, dyspnea on exertion, edema or irregular heartbeat Gastrointestinal ROS: negative for - abdominal pain, diarrhea, hematemesis, nausea/vomiting or stool incontinence Genito-Urinary ROS: negative for - dysuria, hematuria, incontinence or urinary frequency/urgency Musculoskeletal ROS: left foot drop Neurological ROS: as noted in HPI Dermatological ROS: negative for rash and skin lesion changes  Physical Examination: Blood pressure 152/75, pulse 84, temperature 98 F (36.7 C), temperature source Oral, resp. rate 20, height 5\' 7"  (1.702 m), weight 69.854 kg (154 lb), SpO2 100 %.  Neurologic Examination: Mental Status: Alert, oriented, thought content appropriate.  Speech fluent without evidence of aphasia.  Dysarthric.  Able to follow 3 step commands without difficulty. Cranial Nerves: II: Discs flat bilaterally; Visual fields grossly normal, pupils equal, round, reactive to light and accommodation III,IV, VI: ptosis not present, extra-ocular motions intact bilaterally V,VII: smile symmetric, facial light touch sensation normal bilaterally VIII: hearing normal bilaterally IX,X: gag reflex present XI: bilateral shoulder shrug XII: midline tongue extension Motor: Right : Upper extremity   5/5    Left:     Upper extremity   5/5 with foot drop  Lower extremity   5/5     Lower extremity   5/5 Tone and bulk:normal tone throughout; no atrophy noted Sensory: Pinprick and light touch intact throughout, bilaterally Deep Tendon Reflexes: 2+ and symmetric with absent AJ's bilaterally Plantars: Right: downgoing   Left: downgoing Cerebellar: normal finger-to-nose and normal heel-to-shin testing bilaterally Gait: Unable to test CV: pulses palpable throughout     Laboratory Studies:  Basic Metabolic Panel:  Recent Labs Lab 03/30/14 2159  NA 130*  K 4.5  CL 94*  CO2 26  GLUCOSE 98   BUN 15  CREATININE 0.99  CALCIUM 9.6    Liver Function Tests:  Recent Labs Lab 03/30/14 2159  AST 38*  ALT 38  ALKPHOS 79  BILITOT 0.2*  PROT 6.9  ALBUMIN 3.8   No results for input(s): LIPASE, AMYLASE in the last 168 hours. No results for input(s): AMMONIA in the last 168 hours.  CBC:  Recent Labs Lab 03/30/14 2159  WBC 5.5  NEUTROABS 3.3  HGB 11.9*  HCT 32.4*  MCV 85.5  PLT 264    Cardiac Enzymes: No results for input(s): CKTOTAL, CKMB, CKMBINDEX, TROPONINI in the last 168 hours.  BNP: Invalid input(s): POCBNP  CBG: No results for input(s): GLUCAP in the last 168 hours.  Microbiology: No results found for this or any previous visit.  Coagulation Studies:  Recent Labs  03/30/14 2159  LABPROT 13.7  INR 1.03    Urinalysis:  Recent Labs Lab 03/30/14 2213  COLORURINE YELLOW  LABSPEC 1.012  PHURINE 6.0  GLUCOSEU NEGATIVE  HGBUR NEGATIVE  BILIRUBINUR NEGATIVE  KETONESUR NEGATIVE  PROTEINUR NEGATIVE  UROBILINOGEN 0.2  NITRITE NEGATIVE  LEUKOCYTESUR NEGATIVE  Lipid Panel: No results found for: CHOL, TRIG, HDL, CHOLHDL, VLDL, LDLCALC  HgbA1C: No results found for: HGBA1C  Urine Drug Screen:  No results found for: LABOPIA, COCAINSCRNUR, LABBENZ, AMPHETMU, THCU, LABBARB  Alcohol Level: No results for input(s): ETH in the last 168 hours.  Other results: EKG: normal sinus rhythm at 67 bpm, left axis deviation.  Imaging: Ct Head Wo Contrast  03/30/2014   CLINICAL DATA:  Slurred speech, confusion, and right-sided weakness. Recent transient ischemic attack.  EXAM: CT HEAD WITHOUT CONTRAST  TECHNIQUE: Contiguous axial images were obtained from the base of the skull through the vertex without intravenous contrast.  COMPARISON:  None.  FINDINGS: No mass lesion. No midline shift. No acute hemorrhage or hematoma. No extra-axial fluid collections. No evidence of acute infarction. There is slight periventricular white matter lucency consistent with  chronic small vessel ischemic disease. Slight prominence of the lateral ventricles.  No osseous abnormality.  IMPRESSION: No acute abnormality. Minimal chronic small vessel ischemic disease.   Electronically Signed   By: Geanie CooleyJim  Maxwell M.D.   On: 03/30/2014 22:42    Assessment: 73 y.o. male presenting with episodic neurological complaints.  Patient currently at baseline.  On ASA and Plavix.  Plavix just started.  Head CT reviewed and shows no acute changes.  TIA likely.  Further work up recommended.    Stroke Risk Factors - hypertension  Plan: 1. HgbA1c, fasting lipid panel 2. MRI, MRA  of the brain without contrast 3. PT consult, OT consult, Speech consult 4. Echocardiogram 5. Carotid dopplers 6. Prophylactic therapy-Continue Plavix 7. Risk factor modification 8. Telemetry monitoring 9. Frequent neuro checks    Gregory FarrLeslie Deshone Lyssy, MD [facility] [phone] 03/31/2014, 5:58 AM

## 2014-04-01 DIAGNOSIS — G459 Transient cerebral ischemic attack, unspecified: Secondary | ICD-10-CM

## 2014-04-01 DIAGNOSIS — I10 Essential (primary) hypertension: Secondary | ICD-10-CM

## 2014-04-01 DIAGNOSIS — E039 Hypothyroidism, unspecified: Secondary | ICD-10-CM | POA: Diagnosis present

## 2014-04-01 DIAGNOSIS — G458 Other transient cerebral ischemic attacks and related syndromes: Secondary | ICD-10-CM

## 2014-04-01 DIAGNOSIS — K219 Gastro-esophageal reflux disease without esophagitis: Secondary | ICD-10-CM | POA: Diagnosis present

## 2014-04-01 DIAGNOSIS — I6789 Other cerebrovascular disease: Secondary | ICD-10-CM

## 2014-04-01 MED ORDER — AMLODIPINE BESYLATE 5 MG PO TABS
5.0000 mg | ORAL_TABLET | Freq: Every day | ORAL | Status: DC
  Filled 2014-04-01: qty 1

## 2014-04-01 MED ORDER — PANTOPRAZOLE SODIUM 40 MG PO TBEC
40.0000 mg | DELAYED_RELEASE_TABLET | Freq: Every day | ORAL | Status: DC
  Administered 2014-04-01: 40 mg via ORAL
  Filled 2014-04-01: qty 1

## 2014-04-01 MED ORDER — ATORVASTATIN CALCIUM 10 MG PO TABS: 5.0000 mg | ORAL_TABLET | Freq: Every day | ORAL | Status: AC

## 2014-04-01 MED ORDER — LEVOTHYROXINE SODIUM 25 MCG PO TABS: 137.0000 ug | ORAL_TABLET | Freq: Every day | ORAL | Status: DC

## 2014-04-01 MED ORDER — LEVOTHYROXINE SODIUM 25 MCG PO TABS
137.0000 ug | ORAL_TABLET | Freq: Every day | ORAL | Status: DC
  Administered 2014-04-01: 137 ug via ORAL
  Filled 2014-04-01 (×2): qty 1

## 2014-04-01 MED ORDER — ATORVASTATIN CALCIUM 10 MG PO TABS: 5.0000 mg | ORAL_TABLET | Freq: Every day | ORAL | Status: DC

## 2014-04-01 NOTE — Progress Notes (Signed)
STROKE TEAM PROGRESS NOTE   HISTORY Gregory Weaver is an 73 y.o. male who reports that he was in his usual state of health until 1230 on Friday 03/29/2014. At that time he had a 10 minute episode of left hand numbness. This resolved spontaneously. Then on Saturday the patient had an episode of slurred speech, right hand numbness and right facial droop with slurring that lasted about 30 minutes (LKW 03/30/2014 at 1800). Patient presented for evaluation in the ED 03/30/2014 at 2126. Patient is currently at baseline. Prior to these events the patient was on 162mg  of ASA daily. He saw his PCP on Friday and Plavix was added.Patient had a previous TIA many years ago. Patient was not administered TPA due to resolution of symptoms   SUBJECTIVE (INTERVAL HISTORY) His wife is at the bedside. Dr. Pearlean Brownie discussed diagnosis, prognosis,  treatment options and plan of care with patient and wife - lots of talk related to statin/lipids..    OBJECTIVE Temp:  [97.6 F (36.4 C)-98.7 F (37.1 C)] 97.7 F (36.5 C) (11/02 0949) Pulse Rate:  [67-86] 67 (11/02 0949) Cardiac Rhythm:  [-] Normal sinus rhythm (11/01 2000) Resp:  [20] 20 (11/02 0949) BP: (138-175)/(45-57) 165/45 mmHg (11/02 0949) SpO2:  [96 %-100 %] 100 % (11/02 0949)   Recent Labs Lab 03/30/14 2159  NA 130*  K 4.5  CL 94*  CO2 26  GLUCOSE 98  BUN 15  CREATININE 0.99  CALCIUM 9.6    Recent Labs Lab 03/30/14 2159  AST 38*  ALT 38  ALKPHOS 79  BILITOT 0.2*  PROT 6.9  ALBUMIN 3.8    Recent Labs Lab 03/30/14 2159  WBC 5.5  NEUTROABS 3.3  HGB 11.9*  HCT 32.4*  MCV 85.5  PLT 264   No results for input(s): CKTOTAL, CKMB, CKMBINDEX, TROPONINI in the last 168 hours.  Recent Labs  03/30/14 2159  LABPROT 13.7  INR 1.03    Recent Labs  03/30/14 2213  COLORURINE YELLOW  LABSPEC 1.012  PHURINE 6.0  GLUCOSEU NEGATIVE  HGBUR NEGATIVE  BILIRUBINUR NEGATIVE  KETONESUR NEGATIVE  PROTEINUR NEGATIVE  UROBILINOGEN  0.2  NITRITE NEGATIVE  LEUKOCYTESUR NEGATIVE       Component Value Date/Time   CHOL 161 03/31/2014 0715   TRIG 141 03/31/2014 0715   HDL 34* 03/31/2014 0715   CHOLHDL 4.7 03/31/2014 0715   VLDL 28 03/31/2014 0715   LDLCALC 99 03/31/2014 0715   Lab Results  Component Value Date   HGBA1C 5.6 03/31/2014   No results found for: LABOPIA, COCAINSCRNUR, LABBENZ, AMPHETMU, THCU, LABBARB  No results for input(s): ETH in the last 168 hours.  Ct Head Wo Contrast 03/30/2014    No acute abnormality. Minimal chronic small vessel ischemic disease.     Mri & Mra Brain Wo Contrast 03/31/2014   negative for acute infarct.  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 MRI cervical spine recommended for further evaluation as well as correlation with any history of fracture or prior imaging studies.  Moderate stenosis right M1 segment. No other significant intracranial stenosis.      2D Echocardiogram  EF 55-60% with no source of embolus.   Carotid Doppler  Bilateral: 1-39% ICA stenosis. Vertebral artery flow is antegrade. Left: Loss of diastolic vertebral artery flow is noted.   PHYSICAL EXAM GENERAL: Thin pleasant and in no acute distress. HEENT: Supple. Atraumatic normocephalic.  ABDOMEN: soft EXTREMITIES: No  edema  BACK: Normal. SKIN: Normal by inspection. MENTAL STATUS: Alert and oriented. Speech- mild dysarthria; language and cognition are generally intact. Judgment and insight normal.  CRANIAL NERVES: Pupils are equal, round and reactive to light and accommodation; extra ocular movements are full, there is no significant nystagmus; visual fields are full; upper and lower facial muscles are normal in strength and symmetric, there is no flattening of the nasolabial folds; tongue is midline; uvula is midline; shoulder elevation is normal. MOTOR: Normal tone, bulk and strength; no pronator  drift. COORDINATION: Left finger to nose is normal, right finger to nose is normal, No rest tremor; no intention tremor; no postural tremor; no bradykinesia. SENSATION: Normal to light touch.  ASSESSMENT/PLAN Mr. Gregory Weaver is a 73 y.o. male with history of  hypertension, hypothyroidism, and a prior TIA presenting with transient left hand numbness, right hand numbness, slurred speech, and facial droop. He did not receive IV t-PA as his deficits had resolved.  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  Carotid Doppler  No significant stenosis   2D Echo  No source of embolus   HgbA1c 5.6  Subcutaneous heparin for VTE prophylaxis  Activity as tolerated  aspirin 81 mg orally every day and clopidogrel 75 mg orally every day prior to admission, now on clopidogrel 75 mg orally every day (Plavix was just started Friday by his primary care physician)  Patient counseled to be compliant with his antithrombotic medications  Risk factor education  Ongoing aggressive risk factor management  Resultant - resolution of deficits  Therapy recommendations:  No therapy needs  Disposition:  Return home  No further stroke workup indicated.  Patient has a 10-15% risk of having another stroke over the next year, the highest risk is within 2 weeks of the most recent stroke/TIA (risk of having a stroke following a stroke or TIA is the same).  Ongoing risk factor control by Primary Care Physician  Stroke Service will sign off. Please call should any needs arise.  [facility]), order placed.   Hypertension  Home meds: no antihypertensive medications prior to admission  Systolic blood pressure is high at times - 150-170s  Had been on amlodipine and losartan as an OP - even on low doses and only 1 drug, his BP was too low  For now, we do not recommend  agressive control. Do recommend long-term normalization of BP. Recommend follow up with primary MD.  Hyperlipidemia  Home meds:  No lipid lowering medications prior to admission. Had stopped lipitor in June as his cholesterol was too low. He also had other electrolyte abnormalities as well as muscle aches and pains that have all resolved/improved since he had stopped lipitor. Wife reports he had tried other statins prior to lipitor.  Continue statin at discharge  LDL 99 goal < 70  Had been placed on Lipitor 20 mg daily. Given history, will decrease dose to 5 mg daily (can also do 10 mg every other day). Is he continues to have problems on lipitor, recommend evaluation of one of the new cholesterol lowering injections as an OP.  Other Stroke Risk Factors Advanced age . Hx stroke/TIA  Other Active Problems  Mild anemia  Mild hyponatremia  Mildly elevated liver function tests  Other Pertinent History  Previous TIAs  Hospital day # 2  SHARON BIBY, MSN, APRN,  ANVP-BC, AGPCNP-BC [facility] Pager: [phone] 04/01/2014 1:12 PM  I have personally examined this patient, reviewed notes, independently viewed imaging studies, participated in medical decision making and plan of care. I have made any additions or clarifications directly to the above note. Agree with note above. 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 Continue Plavix as it was just started.I counseled the patient and wife to restart Lipitor low-dose 5 mg daily and he was intolerant to higher dose. Incase he has  Multiple statin intolerance may need to consider the new injectable cholesterol medications later as an outpatient after discussion with his cardiologist. Delia HeadyPramod Sethi, MD Medical Director [facility] Pager: [phone] 04/01/2014 4:07 PM  To contact Stroke Continuity provider, please refer to [email or web address]. After hours, contact General Neurology

## 2014-04-01 NOTE — Progress Notes (Signed)
SLP Cancellation Note  Patient Details Name: Gregory Weaver MRN: [id number] DOB: 01-27-1941   Cancelled treatment:       Reason Eval/Treat Not Completed: SLP screened, no needs identified, will sign off. Pt and wife report that his symptoms have resolved, and that he is at his cognitive-linguistic baseline.     Gregory Weaver, M.A. CCC-SLP [phone]\  Gregory Hamaiewonsky, Gregory Weaver 04/01/2014, 2:27 PM

## 2014-04-01 NOTE — Progress Notes (Signed)
Discharge orders received.  Discharge instructions and follow-up appointments reviewed with the patient and his wife.  IV removed and education complete.  Walked out with nurse and wife present. Sondra ComeSilva, Nyemah Watton M, RN 04/01/2014

## 2014-04-01 NOTE — Progress Notes (Signed)
UR completed 

## 2014-04-01 NOTE — Progress Notes (Signed)
Physical Therapy Treatment Patient Details Name: Stelios Kirby MRN: [id number] DOB: [birth year] Today's Date: 04/01/2014    History of Present Illness Patient is a 73 yo male admitted 03/30/14 with RUE weakness and aphasia to r/o CVA/TIA.  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  Patient and wife report patient at baseline functional status.  No further PT needs identified - PT will sign off.  Patient ready for d/c from PT perspective.  Follow Up Recommendations  No PT follow up;Supervision/Assistance - 24 hour     Equipment Recommendations  None recommended by PT    Recommendations for Other Services       Precautions / Restrictions Precautions Precautions: Fall Restrictions Weight Bearing Restrictions: No    Mobility  Bed Mobility                  Transfers Overall transfer level: Modified independent Equipment used: None Transfers: Sit to/from Stand Sit to Stand: Modified independent (Device/Increase time)         General transfer comment: Increased time.  Proper use of UE's.  Ambulation/Gait Ambulation/Gait assistance: Modified independent (Device/Increase time) Ambulation Distance (Feet): 140 Feet Assistive device: None Gait Pattern/deviations: Step-through pattern;Decreased dorsiflexion - left;Steppage Gait velocity: Decreased Gait velocity interpretation: Below normal speed for age/gender General Gait Details: Continues to have steppage gait with decreased DF on left.  Balance improved today - no loss of balance with ambulation.   Stairs            Wheelchair Mobility    Modified Rankin (Stroke Patients Only) Modified Rankin (Stroke Patients Only) Pre-Morbid Rankin Score: Slight disability Modified Rankin: Slight disability     Balance Overall balance assessment: Modified Independent         Standing balance support: No upper  extremity supported Standing balance-Leahy Scale: Good Standing balance comment: No loss of balance with mobility/gait.                    Cognition Arousal/Alertness: Awake/alert Behavior During Therapy: WFL for tasks assessed/performed Overall Cognitive Status: Within Functional Limits for tasks assessed                      Exercises      General Comments        Pertinent Vitals/Pain Pain Assessment: No/denies pain    Home Living                      Prior Function            PT Goals (current goals can now be found in the care plan section) Acute Rehab PT Goals Patient Stated Goal: To go home Progress towards PT goals: Goals met/education completed, patient discharged from PT    Frequency  Min 3X/week    PT Plan Current plan remains appropriate    Co-evaluation             End of Session   Activity Tolerance: Patient tolerated treatment well Patient left: in chair;with call bell/phone within reach;with family/visitor present     Time: 1007-1016 PT Time Calculation (min): 9 min  Charges:  $Gait Training: 8-22 mins                    G Codes:  Functional Assessment Tool Used: Clinical judgement Functional Limitation: Mobility:  Walking and moving around Mobility: Walking and Moving Around Goal Status [phone]): At least 1 percent but less than 20 percent impaired, limited or restricted Mobility: Walking and Moving Around Discharge Status [phone]): 0 percent impaired, limited or restricted   Despina Pole 04/01/2014, 10:24 AM Carita Pian. Sanjuana Kava, [place] Pager [phone]

## 2014-04-01 NOTE — Progress Notes (Signed)
*  PRELIMINARY RESULTS* Echocardiogram 2D Echocardiogram has been performed.  Jeryl ColumbiaLLIOTT, Jahleah Mariscal 04/01/2014, 9:13 AM

## 2014-04-01 NOTE — Progress Notes (Signed)
*  PRELIMINARY RESULTS* Vascular Ultrasound Carotid Duplex (Doppler) has been completed.  Preliminary findings: Bilateral: 1-39% ICA stenosis. Vertebral artery flow is antegrade. Left: Loss of diastolic vertebral artery flow is noted.   Noell Lorensen 04/01/2014, 10:50 AM

## 2014-04-01 NOTE — Evaluation (Signed)
Occupational Therapy Evaluation Patient Details Name: Gregory Weaver MRN: [id number] DOB: [birth year] Today's Date: 04/01/2014    History of Present Illness Patient is a 73 yo male admitted 03/30/14 with RUE weakness and aphasia to r/o CVA/TIA.  PMH: HTN, TIA, anemia, arthritis, h/o back injury with Lt drop foot   Clinical Impression   Pt is back to his baseline in ADL and ADL transfers, no further OT needs.    Follow Up Recommendations  No OT follow up    Equipment Recommendations  None recommended by OT    Recommendations for Other Services       Precautions / Restrictions Precautions Precautions: Fall Precaution Comments: Lt foot drop.  Patient wears shoes any time up. Restrictions Weight Bearing Restrictions: No      Mobility Bed Mobility                  Transfers Overall transfer level: Modified independent Equipment used: None Transfers: Sit to/from Stand Sit to Stand: Modified independent (Device/Increase time)         General transfer comment: Increased time.  Proper use of UE's.    Balance Overall balance assessment: Modified Independent         Standing balance support: No upper extremity supported Standing balance-Leahy Scale: Good Standing balance comment: No loss of balance with mobility/gait.                            ADL Overall ADL's : At baseline                                       General ADL Comments: Wife is quick to assist pt, but unnecessary.     Vision                     Perception     Praxis      Pertinent Vitals/Pain Pain Assessment: No/denies pain     Hand Dominance Right   Extremity/Trunk Assessment Upper Extremity Assessment Upper Extremity Assessment: Overall WFL for tasks assessed (able to perform coordination activities with difficulty)           Communication Communication Communication: Expressive difficulties (at baseline)   Cognition  Arousal/Alertness: Awake/alert Behavior During Therapy: WFL for tasks assessed/performed Overall Cognitive Status: Within Functional Limits for tasks assessed                     General Comments       Exercises       Shoulder Instructions      Home Living Family/patient expects to be discharged to:: Private residence Living Arrangements: Spouse/significant other Available Help at Discharge: Family;Available 24 hours/day Type of Home: House Home Access: Stairs to enter Entergy CorporationEntrance Stairs-Number of Steps: 6 Entrance Stairs-Rails: Right;Left Home Layout: One level     Bathroom Shower/Tub: Chief Strategy OfficerTub/shower unit   Bathroom Toilet: Standard     Home Equipment: Electric scooter;Crutches;Grab bars - tub/shower          Prior Functioning/Environment Level of Independence: Needs assistance  Gait / Transfers Assistance Needed: Has assist on stairs.  Uses scooter for longer distances outside of house. ADL's / Homemaking Assistance Needed: gets down in tub to bathe   Comments: wears high top shoes    OT Diagnosis:     OT Problem List:     OT  Treatment/Interventions:      OT Goals(Current goals can be found in the care plan section) Acute Rehab OT Goals Patient Stated Goal: To go home  OT Frequency:     Barriers to D/C:            Co-evaluation              End of Session    Activity Tolerance: Patient tolerated treatment well Patient left: in bed;with family/visitor present;with call bell/phone within reach   Time: [id number] OT Time Calculation (min): 29 min Charges:  OT General Charges $OT Visit: 1 Procedure OT Evaluation $Initial OT Evaluation Tier I: 1 Procedure G-Codes: OT G-codes **NOT FOR INPATIENT CLASS** Functional Assessment Tool Used: clinical judgement Functional Limitation: Self care Self Care Current Status (V4098(G8987): At least 1 percent but less than 20 percent impaired, limited or restricted Self Care Goal Status ([id number](G8988): At least 1  percent but less than 20 percent impaired, limited or restricted Self Care Discharge Status [phone](G8989): At least 1 percent but less than 20 percent impaired, limited or restricted  [name], Yevonne Yokum Lynn 04/01/2014, 11:41 AM  [phone]

## 2014-04-01 NOTE — Discharge Summary (Signed)
Physician Discharge Summary  Gregory Weaver ZOX:[id number] DOB: Nov 18, 1940 DOA: 03/30/2014  PCP: No primary care provider on file.  Admit date: 03/30/2014 Discharge date: 04/01/2014  Time spent: > 35  minutes  Recommendations for Outpatient Follow-up:  1. Patient to follow-up with primary care physician within the next one or 2 weeks  Discharge Diagnoses:  Principal Problem:   TIA (transient ischemic attack) Hypothyroidism Hyperlipidemia, GERD Essential htn  Discharge Condition: stable  Diet recommendation: heart healthy, low sodium diet  Filed Weights   03/30/14 2136  Weight: 69.854 kg (154 lb)    History of present illness:  From original HPI: Gregory Weaver is a 73 y.o. male h/o HTN, hypothyroidism, prior TIA. Patient presents to the ED with episode of L hand numbness yesterday that lasted for approximately 10 mins. He was seen by his PCP yesterday at [facility] clinic and was started on plavix which he took yesterday but has not taken today. Just PTA he had an episode of R hand numbness, R facial droop, and aphasia that lasted for 30 mins and resolved prior to evaluation in the ED. He is now at baseline and symptom free he states. Denies headache or head injury.  Hospital Course:  TIA (transient ischemic attack) - Evaluated by Neurologywhile in house and recommended the following: TIA: Dominant R brain  MRI Negative for acute infarct  MRA Moderate stenosis of the right M1 segment  Carotid Doppler No significant stenosis   2D Echo No source of embolus  HgbA1c 5.6  Subcutaneous heparin for VTE prophylaxis  Activity as tolerated  aspirin 81 mg orally every day and clopidogrel 75 mg orally every day prior to admission, now on clopidogrel 75 mg orally every day (Plavix was just started Friday by his primary care physician)  Patient counseled to be compliant with his antithrombotic medications  Risk factor education  Ongoing aggressive risk  factor management  Resultant - resolution of deficits  Therapy recommendations: No therapy needs  Disposition: Return home  No further stroke workup indicated.  Patient has a 10-15% risk of having another stroke over the next year, the highest risk is within 2 weeks of the most recent stroke/TIA (risk of having a stroke following a stroke or TIA is the same).  Ongoing risk factor control by Primary Care Physician  Stroke Service will sign off. Please call should any needs arise.  Follow up with Dr. Delia Heady at [facility] in 1 month(s), order placed.   Hypothyroidism - stable continue home regimen Synthroid  Hyperlipidemia - LDL currently not at goal given TIA and secondary stroke prevention guidelines. Was started on Lipitor at the recommendation of neurologist.  GERD - stable on patient's home regimen will continue  Essential htn - family would like to discuss blood pressure medication management with primary care physician and refused any new blood pressure medication on discharge.  Procedures: Ct Head Wo Contrast 03/30/2014  No acute abnormality. Minimal chronic small vessel ischemic disease.   Mri & Mra Brain Wo Contrast 03/31/2014 negative for acute infarct. 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 MRI cervical spine recommended for further evaluation as well as correlation with any history of fracture or prior imaging studies. Moderate stenosis right M1 segment. No other significant intracranial stenosis.   2D Echocardiogram EF 55-60% with no source of embolus.   Carotid Doppler Bilateral: 1-39% ICA stenosis. Vertebral artery flow is antegrade. Left: Loss  of diastolic vertebral artery flow is noted.   Consultations:  Neurology  Discharge Exam: Filed Vitals:   04/01/14 1351  BP: 175/52  Pulse: 70  Temp: 97.7 F (36.5 C)   Resp: 20    General: Pt in nad, alert and awake Cardiovascular: rrr, no mrg Respiratory: cta bl, no wheezes  Discharge Instructions You were cared for by a hospitalist during your hospital stay. If you have any questions about your discharge medications or the care you received while you were in the hospital after you are discharged, you can call the unit and asked to speak with the hospitalist on call if the hospitalist that took care of you is not available. Once you are discharged, your primary care physician will handle any further medical issues. Please note that NO REFILLS for any discharge medications will be authorized once you are discharged, as it is imperative that you return to your primary care physician (or establish a relationship with a primary care physician if you do not have one) for your aftercare needs so that they can reassess your need for medications and monitor your lab values.  Discharge Instructions    Ambulatory referral to Neurology    Complete by:  As directed   Stroke patient. Dr. Pearlean BrownieSethi prefers follow up in 1 month     Call MD for:  difficulty breathing, headache or visual disturbances    Complete by:  As directed      Call MD for:  temperature >100.4    Complete by:  As directed      Diet - low sodium heart healthy    Complete by:  As directed      Discharge instructions    Complete by:  As directed   Please follow-up with your primary care physician regarding further recommendations for your blood pressures. You are to continue Plavix and Lipitor on discharge for secondary stroke prevention.     Increase activity slowly    Complete by:  As directed           Current Discharge Medication List    START taking these medications   Details  atorvastatin (LIPITOR) 10 MG tablet Take 0.5 tablets (5 mg total) by mouth daily at 6 PM. Qty: 30 tablet, Refills: 0   Associated Diagnoses: Hyperlipidemia      CONTINUE these medications which have NOT CHANGED    Details  acetaminophen (TYLENOL) 500 MG tablet Take 500 mg by mouth every 6 (six) hours as needed for mild pain.    Cholecalciferol (VITAMIN D-3) 1000 UNITS CAPS Take 1 capsule by mouth daily.    clopidogrel (PLAVIX) 75 MG tablet Take 75 mg by mouth daily.    ferrous sulfate 325 (65 FE) MG tablet Take 325 mg by mouth daily with lunch.    fluticasone (FLONASE) 50 MCG/ACT nasal spray Place 2 sprays into both nostrils daily as needed for allergies or rhinitis.    folic acid (FOLVITE) 1 MG tablet Take 1 mg by mouth daily.    levothyroxine (SYNTHROID, LEVOTHROID) 137 MCG tablet Take 137 mcg by mouth daily before breakfast.    methotrexate (RHEUMATREX) 2.5 MG tablet Take 10 mg by mouth once a week. Caution:Chemotherapy. Protect from light. Every Thursday    Omega-3 Fatty Acids (SALMON OIL-1000 PO) Take 2 capsules by mouth 2 (two) times daily.    omeprazole (PRILOSEC) 20 MG capsule Take 20 mg by mouth daily.    vitamin B-12 (CYANOCOBALAMIN) 1000 MCG tablet Take 1,000 mcg by mouth  daily.      STOP taking these medications     aspirin EC 81 MG tablet        Allergies  Allergen Reactions  . Codeine Nausea And Vomiting  . Guaifenesin & Derivatives     Chest pain   Follow-up Information    Follow up with SETHI,PRAMOD, MD In 1 month.   Specialties:  Neurology, Radiology   Why:  Stroke Clinic, Office will call you with appointment date & time   Contact information:   [address] [place] [address] [phone]        The results of significant diagnostics from this hospitalization (including imaging, microbiology, ancillary and laboratory) are listed below for reference.    Significant Diagnostic Studies: Ct Head Wo Contrast  03/30/2014   CLINICAL DATA:  Slurred speech, confusion, and right-sided weakness. Recent transient ischemic attack.  EXAM: CT HEAD WITHOUT CONTRAST  TECHNIQUE: Contiguous axial images were obtained from the base of the skull through the  vertex without intravenous contrast.  COMPARISON:  None.  FINDINGS: No mass lesion. No midline shift. No acute hemorrhage or hematoma. No extra-axial fluid collections. No evidence of acute infarction. There is slight periventricular white matter lucency consistent with chronic small vessel ischemic disease. Slight prominence of the lateral ventricles.  No osseous abnormality.  IMPRESSION: No acute abnormality. Minimal chronic small vessel ischemic disease.   Electronically Signed   By: Geanie Cooley M.D.   On: 03/30/2014 22:42   Mr Brain Wo Contrast  03/31/2014   CLINICAL DATA:  Stroke. Left hand numbness has resolved. Also slurred speech.  EXAM: MRI HEAD WITHOUT CONTRAST  MRA HEAD WITHOUT CONTRAST  TECHNIQUE: Multiplanar, multiecho pulse sequences of the brain and surrounding structures were obtained without intravenous contrast. Angiographic images of the head were obtained using MRA technique without contrast.  COMPARISON:  CT head 03/30/2014  FINDINGS: MRI HEAD FINDINGS  Mild atrophy. Ventricle size is mildly enlarged consistent with the level of atrophy.  Os odontoideum versus chronic fracture of the dens with posterior displacement of the dens by approximately 10 mm. This is causing impingement of the spinal cord at the craniocervical junction. 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 Further evaluation with cervical MRI and correlation with any prior history is suggested. No acute bone marrow edema is present and this appears chronic.  Negative for acute infarct.  Chronic microvascular ischemic changes in the white matter. 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  Chronic microhemorrhage right brachium pontis. Cluster of chronic micro hemorrhage in the right parietal lobe.  Negative for mass or edema.  No shift of the midline structures.  Paranasal sinuses are clear.  MRA HEAD FINDINGS  Both vertebral arteries are patent to the basilar without significant stenosis. PICA  patent bilaterally. Basilar widely patent. AICA, superior cerebellar, posterior cerebral arteries are normal  Internal carotid artery normal bilaterally. Variant anterior cerebral artery anatomy. 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  Moderate stenosis of the right M1 segment. Right middle cerebral artery branches are patent. Left middle cerebral artery widely patent  Negative for cerebral aneurysm.  IMPRESSION: Negative for acute infarct.  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 MRI cervical spine recommended for further evaluation as well as correlation with any history of fracture or prior imaging studies.  Moderate stenosis right M1 segment.  No other significant intracranial stenosis.   Electronically Signed   By: Marlan Palauharles  Clark M.D.   On: 03/31/2014 13:59   Mr Maxine GlennMra Head/brain Wo Cm  03/31/2014   CLINICAL DATA:  Stroke. Left hand numbness has resolved. Also slurred speech.  EXAM: MRI HEAD WITHOUT CONTRAST  MRA HEAD WITHOUT CONTRAST  TECHNIQUE: Multiplanar, multiecho pulse sequences of the brain and surrounding structures were obtained without intravenous contrast. Angiographic images of the head were obtained using MRA technique without contrast.  COMPARISON:  CT head 03/30/2014  FINDINGS: MRI HEAD FINDINGS  Mild atrophy. Ventricle size is mildly enlarged consistent with the level of atrophy.  Os odontoideum versus chronic fracture of the dens with posterior displacement of the dens by approximately 10 mm. This is causing impingement of the spinal cord at the craniocervical junction. 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 Further evaluation with cervical MRI and correlation with any prior history is suggested. No acute bone marrow edema is present and this appears chronic.  Negative for acute infarct.  Chronic microvascular ischemic changes in the white matter.  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  Chronic microhemorrhage right brachium pontis. Cluster of chronic micro hemorrhage in the right parietal lobe.  Negative for mass or edema.  No shift of the midline structures.  Paranasal sinuses are clear.  MRA HEAD FINDINGS  Both vertebral arteries are patent to the basilar without significant stenosis. PICA patent bilaterally. Basilar widely patent. AICA, superior cerebellar, posterior cerebral arteries are normal  Internal carotid artery normal bilaterally. Variant anterior cerebral artery anatomy. 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  Moderate stenosis of the right M1 segment. Right middle cerebral artery branches are patent. Left middle cerebral artery widely patent  Negative for cerebral aneurysm.  IMPRESSION: Negative for acute infarct.  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 MRI cervical spine recommended for further evaluation as well as correlation with any history of fracture or prior imaging studies.  Moderate stenosis right M1 segment. No other significant intracranial stenosis.   Electronically Signed   By: Marlan Palauharles  Clark M.D.   On: 03/31/2014 13:59    Microbiology: No results found for this or any previous visit (from the past 240 hour(s)).   Labs: Basic Metabolic Panel:  Recent Labs Lab 03/30/14 2159  NA 130*  K 4.5  CL 94*  CO2 26  GLUCOSE 98  BUN 15  CREATININE 0.99  CALCIUM 9.6   Liver Function Tests:  Recent Labs Lab 03/30/14 2159  AST 38*  ALT 38  ALKPHOS 79  BILITOT 0.2*  PROT 6.9  ALBUMIN 3.8   No results for input(s): LIPASE, AMYLASE in the last 168 hours. No results for input(s): AMMONIA in the last 168 hours. CBC:  Recent Labs Lab 03/30/14 2159  WBC 5.5  NEUTROABS 3.3  HGB 11.9*  HCT 32.4*  MCV 85.5   PLT 264   Cardiac Enzymes: No results for input(s): CKTOTAL, CKMB, CKMBINDEX, TROPONINI in the last 168 hours. BNP: BNP (last 3 results) No results for input(s): PROBNP in the last 8760 hours. CBG: No results for input(s): GLUCAP in the last 168 hours.     Signed:  Penny PiaVEGA, Ilana Prezioso  [facility] 04/01/2014, 3:06 PM

## 2014-04-01 NOTE — Plan of Care (Signed)
Problem: Acute Rehab PT Goals(only PT should resolve) Goal: Patient Will Transfer Sit To/From Stand Outcome: Completed/Met Date Met:  04/01/14 Goal: Pt Will Ambulate Outcome: Completed/Met Date Met:  04/01/14

## 2014-04-02 DIAGNOSIS — I639 Cerebral infarction, unspecified: Secondary | ICD-10-CM | POA: Insufficient documentation

## 2014-04-03 ENCOUNTER — Emergency Department: Payer: Self-pay | Admitting: Emergency Medicine

## 2014-04-03 LAB — CBC
HCT: 34.3 % — ABNORMAL LOW (ref 40.0–52.0)
HGB: 11.6 g/dL — ABNORMAL LOW (ref 13.0–18.0)
MCH: 30.5 pg (ref 26.0–34.0)
MCHC: 33.8 g/dL (ref 32.0–36.0)
MCV: 90 fL (ref 80–100)
PLATELETS: 268 10*3/uL (ref 150–440)
RBC: 3.8 10*6/uL — AB (ref 4.40–5.90)
RDW: 14.1 % (ref 11.5–14.5)
WBC: 9.2 10*3/uL (ref 3.8–10.6)

## 2014-04-03 LAB — COMPREHENSIVE METABOLIC PANEL
ALBUMIN: 3.5 g/dL (ref 3.4–5.0)
ANION GAP: 10 (ref 7–16)
Alkaline Phosphatase: 90 U/L
BILIRUBIN TOTAL: 0.4 mg/dL (ref 0.2–1.0)
BUN: 12 mg/dL (ref 7–18)
CHLORIDE: 93 mmol/L — AB (ref 98–107)
CREATININE: 1.1 mg/dL (ref 0.60–1.30)
Calcium, Total: 8.6 mg/dL (ref 8.5–10.1)
Co2: 25 mmol/L (ref 21–32)
EGFR (Non-African Amer.): >60
Glucose: 131 mg/dL — ABNORMAL HIGH (ref 65–99)
OSMOLALITY: 259 (ref 275–301)
Potassium: 3.9 mmol/L (ref 3.5–5.1)
SGOT(AST): 21 U/L (ref 15–37)
SGPT (ALT): 23 U/L
SODIUM: 128 mmol/L — AB (ref 136–145)
Total Protein: 7 g/dL (ref 6.4–8.2)

## 2014-04-03 LAB — TROPONIN I
Troponin-I: <0.02 ng/mL
Troponin-I: <0.02 ng/mL

## 2014-04-04 ENCOUNTER — Emergency Department: Payer: Self-pay | Admitting: Emergency Medicine

## 2014-04-05 LAB — PROTIME-INR
INR: 0.9
Prothrombin Time: 12.4 secs (ref 11.5–14.7)

## 2014-04-05 LAB — CBC WITH DIFFERENTIAL/PLATELET
BASOS ABS: 0.1 10*3/uL (ref 0.0–0.1)
BASOS PCT: 1.9 %
Eosinophil #: 0.2 10*3/uL (ref 0.0–0.7)
Eosinophil %: 2.5 %
HCT: 34.4 % — AB (ref 40.0–52.0)
HGB: 11.7 g/dL — ABNORMAL LOW (ref 13.0–18.0)
LYMPHS ABS: 1.5 10*3/uL (ref 1.0–3.6)
Lymphocyte %: 19.8 %
MCH: 30.8 pg (ref 26.0–34.0)
MCHC: 33.9 g/dL (ref 32.0–36.0)
MCV: 91 fL (ref 80–100)
Monocyte #: 0.8 x10 3/mm (ref 0.2–1.0)
Monocyte %: 11.1 %
NEUTROS ABS: 4.8 10*3/uL (ref 1.4–6.5)
Neutrophil %: 64.7 %
PLATELETS: 270 10*3/uL (ref 150–440)
RBC: 3.79 10*6/uL — AB (ref 4.40–5.90)
RDW: 14.2 % (ref 11.5–14.5)
WBC: 7.4 10*3/uL (ref 3.8–10.6)

## 2014-04-05 LAB — COMPREHENSIVE METABOLIC PANEL
ALK PHOS: 88 U/L
AST: 16 U/L (ref 15–37)
Albumin: 3.6 g/dL (ref 3.4–5.0)
Anion Gap: 8 (ref 7–16)
BILIRUBIN TOTAL: 0.3 mg/dL (ref 0.2–1.0)
BUN: 20 mg/dL — AB (ref 7–18)
CALCIUM: 9 mg/dL (ref 8.5–10.1)
Chloride: 94 mmol/L — ABNORMAL LOW (ref 98–107)
Co2: 27 mmol/L (ref 21–32)
Creatinine: 1.17 mg/dL (ref 0.60–1.30)
EGFR (African American): >60
EGFR (Non-African Amer.): >60
Glucose: 119 mg/dL — ABNORMAL HIGH (ref 65–99)
Osmolality: 263 (ref 275–301)
POTASSIUM: 3.9 mmol/L (ref 3.5–5.1)
SGPT (ALT): 19 U/L
Sodium: 129 mmol/L — ABNORMAL LOW (ref 136–145)
Total Protein: 7.3 g/dL (ref 6.4–8.2)

## 2014-04-05 LAB — TROPONIN I

## 2014-04-05 LAB — APTT: ACTIVATED PTT: 32.2 s (ref 23.6–35.9)

## 2014-04-18 ENCOUNTER — Emergency Department: Payer: Self-pay | Admitting: Emergency Medicine

## 2014-04-18 LAB — COMPREHENSIVE METABOLIC PANEL
Albumin: 3.8 g/dL (ref 3.4–5.0)
Alkaline Phosphatase: 73 U/L
Anion Gap: 9 (ref 7–16)
BUN: 19 mg/dL — AB (ref 7–18)
Bilirubin,Total: 0.3 mg/dL (ref 0.2–1.0)
CO2: 24 mmol/L (ref 21–32)
Calcium, Total: 8.6 mg/dL (ref 8.5–10.1)
Chloride: 99 mmol/L (ref 98–107)
Creatinine: 1.16 mg/dL (ref 0.60–1.30)
EGFR (African American): >60
EGFR (Non-African Amer.): >60
GLUCOSE: 76 mg/dL (ref 65–99)
Osmolality: 266 (ref 275–301)
Potassium: 3.8 mmol/L (ref 3.5–5.1)
SGOT(AST): 20 U/L (ref 15–37)
SGPT (ALT): 18 U/L
SODIUM: 132 mmol/L — AB (ref 136–145)
Total Protein: 7.1 g/dL (ref 6.4–8.2)

## 2014-04-18 LAB — CBC
HCT: 34.3 % — ABNORMAL LOW (ref 40.0–52.0)
HGB: 11.4 g/dL — ABNORMAL LOW (ref 13.0–18.0)
MCH: 29.9 pg (ref 26.0–34.0)
MCHC: 33.2 g/dL (ref 32.0–36.0)
MCV: 90 fL (ref 80–100)
PLATELETS: 261 10*3/uL (ref 150–440)
RBC: 3.8 10*6/uL — ABNORMAL LOW (ref 4.40–5.90)
RDW: 14 % (ref 11.5–14.5)
WBC: 7.3 10*3/uL (ref 3.8–10.6)

## 2014-04-18 LAB — PROTIME-INR
INR: 1
Prothrombin Time: 12.8 secs (ref 11.5–14.7)

## 2014-04-18 LAB — APTT: Activated PTT: 30.3 secs (ref 23.6–35.9)

## 2014-04-30 ENCOUNTER — Emergency Department: Payer: Self-pay | Admitting: Emergency Medicine

## 2014-04-30 LAB — BASIC METABOLIC PANEL
Anion Gap: 8 (ref 7–16)
BUN: 18 mg/dL (ref 7–18)
CREATININE: 1.11 mg/dL (ref 0.60–1.30)
Calcium, Total: 9 mg/dL (ref 8.5–10.1)
Chloride: 97 mmol/L — ABNORMAL LOW (ref 98–107)
Co2: 26 mmol/L (ref 21–32)
EGFR (Non-African Amer.): >60
Glucose: 71 mg/dL (ref 65–99)
Osmolality: 263 (ref 275–301)
Potassium: 4.4 mmol/L (ref 3.5–5.1)
SODIUM: 131 mmol/L — AB (ref 136–145)

## 2014-04-30 LAB — CBC
HCT: 33.1 % — ABNORMAL LOW (ref 40.0–52.0)
HGB: 11.2 g/dL — AB (ref 13.0–18.0)
MCH: 30.3 pg (ref 26.0–34.0)
MCHC: 33.9 g/dL (ref 32.0–36.0)
MCV: 90 fL (ref 80–100)
PLATELETS: 271 10*3/uL (ref 150–440)
RBC: 3.7 10*6/uL — ABNORMAL LOW (ref 4.40–5.90)
RDW: 14.1 % (ref 11.5–14.5)
WBC: 8.3 10*3/uL (ref 3.8–10.6)

## 2014-04-30 LAB — TROPONIN I: Troponin-I: <0.02 ng/mL

## 2014-09-28 ENCOUNTER — Emergency Department
Admit: 2014-09-28 | Disposition: A | Discharge status: Home / Self Care | Payer: Self-pay | Admitting: Emergency Medicine

## 2014-09-28 LAB — CBC WITH DIFFERENTIAL/PLATELET
BASOS ABS: 0.1 10*3/uL (ref 0.0–0.1)
Basophil %: 1.3 %
Eosinophil #: 0.1 10*3/uL (ref 0.0–0.7)
Eosinophil %: 1.8 %
HCT: 32.3 % — ABNORMAL LOW (ref 40.0–52.0)
HGB: 10.8 g/dL — ABNORMAL LOW (ref 13.0–18.0)
LYMPHS ABS: 1 10*3/uL (ref 1.0–3.6)
Lymphocyte %: 14.7 %
MCH: 29.4 pg (ref 26.0–34.0)
MCHC: 33.4 g/dL (ref 32.0–36.0)
MCV: 88 fL (ref 80–100)
MONOS PCT: 8.9 %
Monocyte #: 0.6 x10 3/mm (ref 0.2–1.0)
NEUTROS ABS: 4.8 10*3/uL (ref 1.4–6.5)
Neutrophil %: 73.3 %
PLATELETS: 199 10*3/uL (ref 150–440)
RBC: 3.67 10*6/uL — ABNORMAL LOW (ref 4.40–5.90)
RDW: 14.7 % — AB (ref 11.5–14.5)
WBC: 6.5 10*3/uL (ref 3.8–10.6)

## 2014-09-28 LAB — COMPREHENSIVE METABOLIC PANEL
ALK PHOS: 66 U/L
ANION GAP: 7 (ref 7–16)
Albumin: 3.8 g/dL
BUN: 21 mg/dL — ABNORMAL HIGH
Bilirubin,Total: 0.4 mg/dL
CO2: 26 mmol/L
Calcium, Total: 8.4 mg/dL — ABNORMAL LOW
Chloride: 104 mmol/L
Creatinine: 1.02 mg/dL
EGFR (Non-African Amer.): >60
Glucose: 139 mg/dL — ABNORMAL HIGH
POTASSIUM: 3.4 mmol/L — AB
SGOT(AST): 20 U/L
SGPT (ALT): 15 U/L — ABNORMAL LOW
SODIUM: 137 mmol/L
Total Protein: 6.7 g/dL

## 2014-09-28 LAB — PROTIME-INR
INR: 1.1
PROTHROMBIN TIME: 13.9 s

## 2014-09-28 LAB — TROPONIN I: Troponin-I: <0.03 ng/mL

## 2014-09-28 LAB — APTT: ACTIVATED PTT: 30.9 s (ref 23.6–35.9)

## 2014-09-29 ENCOUNTER — Emergency Department: Payer: 59

## 2014-09-29 ENCOUNTER — Observation Stay
Admission: EM | Admit: 2014-09-29 | Discharge: 2014-09-30 | Disposition: A | Discharge status: Home / Self Care | Payer: 59 | Attending: Internal Medicine | Admitting: Internal Medicine

## 2014-09-29 ENCOUNTER — Encounter: Payer: Self-pay | Admitting: Emergency Medicine

## 2014-09-29 ENCOUNTER — Other Ambulatory Visit: Payer: Self-pay

## 2014-09-29 ENCOUNTER — Observation Stay: Payer: 59

## 2014-09-29 DIAGNOSIS — K219 Gastro-esophageal reflux disease without esophagitis: Secondary | ICD-10-CM | POA: Insufficient documentation

## 2014-09-29 DIAGNOSIS — E785 Hyperlipidemia, unspecified: Secondary | ICD-10-CM | POA: Insufficient documentation

## 2014-09-29 DIAGNOSIS — Z87891 Personal history of nicotine dependence: Secondary | ICD-10-CM | POA: Insufficient documentation

## 2014-09-29 DIAGNOSIS — I10 Essential (primary) hypertension: Secondary | ICD-10-CM | POA: Diagnosis not present

## 2014-09-29 DIAGNOSIS — D649 Anemia, unspecified: Secondary | ICD-10-CM | POA: Insufficient documentation

## 2014-09-29 DIAGNOSIS — G459 Transient cerebral ischemic attack, unspecified: Principal | ICD-10-CM

## 2014-09-29 DIAGNOSIS — E039 Hypothyroidism, unspecified: Secondary | ICD-10-CM | POA: Diagnosis not present

## 2014-09-29 DIAGNOSIS — Z9849 Cataract extraction status, unspecified eye: Secondary | ICD-10-CM | POA: Diagnosis not present

## 2014-09-29 DIAGNOSIS — R531 Weakness: Secondary | ICD-10-CM | POA: Diagnosis present

## 2014-09-29 DIAGNOSIS — Z886 Allergy status to analgesic agent status: Secondary | ICD-10-CM | POA: Diagnosis not present

## 2014-09-29 DIAGNOSIS — M1990 Unspecified osteoarthritis, unspecified site: Secondary | ICD-10-CM | POA: Diagnosis not present

## 2014-09-29 DIAGNOSIS — G452 Multiple and bilateral precerebral artery syndromes: Secondary | ICD-10-CM

## 2014-09-29 LAB — URINALYSIS COMPLETE WITH MICROSCOPIC (ARMC ONLY)
Bacteria, UA: NONE SEEN
Bilirubin Urine: NEGATIVE
Glucose, UA: NEGATIVE mg/dL
Hgb urine dipstick: NEGATIVE
Ketones, ur: NEGATIVE mg/dL
Leukocytes, UA: NEGATIVE
NITRITE: NEGATIVE
Protein, ur: 30 mg/dL — AB
SPECIFIC GRAVITY, URINE: 1.015 (ref 1.005–1.030)
Squamous Epithelial / LPF: NONE SEEN
pH: 6 (ref 5.0–8.0)

## 2014-09-29 LAB — COMPREHENSIVE METABOLIC PANEL
ALBUMIN: 4 g/dL (ref 3.5–5.0)
ALK PHOS: 72 U/L (ref 38–126)
ALT: 17 U/L (ref 17–63)
AST: 21 U/L (ref 15–41)
Anion gap: 9 (ref 5–15)
BUN: 20 mg/dL (ref 6–20)
CO2: 26 mmol/L (ref 22–32)
CREATININE: 1.07 mg/dL (ref 0.61–1.24)
Calcium: 8.6 mg/dL — ABNORMAL LOW (ref 8.9–10.3)
Chloride: 104 mmol/L (ref 101–111)
GFR calc Af Amer: >60 mL/min (ref >60)
GFR calc non Af Amer: >60 mL/min (ref >60)
GLUCOSE: 151 mg/dL — AB (ref 65–99)
Potassium: 3.2 mmol/L — ABNORMAL LOW (ref 3.5–5.1)
SODIUM: 139 mmol/L (ref 135–145)
TOTAL PROTEIN: 7.1 g/dL (ref 6.5–8.1)
Total Bilirubin: 0.5 mg/dL (ref 0.3–1.2)

## 2014-09-29 LAB — CBC
HCT: 33.1 % — ABNORMAL LOW (ref 40.0–52.0)
HCT: 34 % — ABNORMAL LOW (ref 40.0–52.0)
HEMOGLOBIN: 11.3 g/dL — AB (ref 13.0–18.0)
Hemoglobin: 11.8 g/dL — ABNORMAL LOW (ref 13.0–18.0)
MCH: 29.7 pg (ref 26.0–34.0)
MCH: 30.3 pg (ref 26.0–34.0)
MCHC: 34.1 g/dL (ref 32.0–36.0)
MCHC: 34.6 g/dL (ref 32.0–36.0)
MCV: 87.2 fL (ref 80.0–100.0)
MCV: 87.6 fL (ref 80.0–100.0)
Platelets: 214 10*3/uL (ref 150–440)
Platelets: 224 10*3/uL (ref 150–440)
RBC: 3.79 MIL/uL — AB (ref 4.40–5.90)
RBC: 3.89 MIL/uL — AB (ref 4.40–5.90)
RDW: 14.3 % (ref 11.5–14.5)
RDW: 14.6 % — ABNORMAL HIGH (ref 11.5–14.5)
WBC: 5.5 10*3/uL (ref 3.8–10.6)
WBC: 7.9 10*3/uL (ref 3.8–10.6)

## 2014-09-29 LAB — APTT: APTT: 30 s (ref 24–36)

## 2014-09-29 LAB — DIFFERENTIAL
Basophils Absolute: 0.1 10*3/uL (ref 0–0.1)
EOS ABS: 0.1 10*3/uL (ref 0–0.7)
Eosinophils Relative: 2 %
LYMPHS ABS: 1.1 10*3/uL (ref 1.0–3.6)
Lymphocytes Relative: 19 %
Monocytes Absolute: 0.4 10*3/uL (ref 0.2–1.0)
NEUTROS ABS: 3.9 10*3/uL (ref 1.4–6.5)

## 2014-09-29 LAB — PROTIME-INR
INR: 1.01
Prothrombin Time: 13.5 seconds (ref 11.4–15.0)

## 2014-09-29 LAB — TROPONIN I

## 2014-09-29 LAB — CREATININE, SERUM
Creatinine, Ser: 1.02 mg/dL (ref 0.61–1.24)
GFR calc Af Amer: >60 mL/min (ref >60)
GFR calc non Af Amer: >60 mL/min (ref >60)

## 2014-09-29 MED ORDER — VITAMIN D3 25 MCG (1000 UNIT) PO TABS
1000.0000 [IU] | ORAL_TABLET | Freq: Every day | ORAL | Status: DC
  Administered 2014-09-30: 1000 [IU] via ORAL
  Filled 2014-09-29 (×2): qty 1

## 2014-09-29 MED ORDER — VITAMIN B-12 1000 MCG PO TABS
1000.0000 ug | ORAL_TABLET | Freq: Every day | ORAL | Status: DC
  Administered 2014-09-30: 1000 ug via ORAL
  Filled 2014-09-29: qty 1

## 2014-09-29 MED ORDER — FLUTICASONE PROPIONATE 50 MCG/ACT NA SUSP
2.0000 | Freq: Every day | NASAL | Status: DC
  Administered 2014-09-30: 2 via NASAL
  Filled 2014-09-29: qty 16

## 2014-09-29 MED ORDER — ATORVASTATIN CALCIUM 10 MG PO TABS
10.0000 mg | ORAL_TABLET | Freq: Every day | ORAL | Status: DC
  Administered 2014-09-29: 10 mg via ORAL
  Filled 2014-09-29: qty 1

## 2014-09-29 MED ORDER — CLOPIDOGREL BISULFATE 75 MG PO TABS
75.0000 mg | ORAL_TABLET | Freq: Every day | ORAL | Status: DC
  Filled 2014-09-29: qty 1

## 2014-09-29 MED ORDER — METHOTREXATE 2.5 MG PO TABS: 10.0000 mg | ORAL_TABLET | ORAL | Status: DC

## 2014-09-29 MED ORDER — ACETAMINOPHEN 325 MG PO TABS
650.0000 mg | ORAL_TABLET | Freq: Four times a day (QID) | ORAL | Status: DC | PRN
  Administered 2014-09-29: 650 mg via ORAL
  Filled 2014-09-29: qty 2

## 2014-09-29 MED ORDER — FERROUS SULFATE 325 (65 FE) MG PO TABS
325.0000 mg | ORAL_TABLET | Freq: Every day | ORAL | Status: DC
  Administered 2014-09-30: 325 mg via ORAL
  Filled 2014-09-29: qty 1

## 2014-09-29 MED ORDER — ACETAMINOPHEN 650 MG RE SUPP: 650.0000 mg | Freq: Four times a day (QID) | RECTAL | Status: DC | PRN

## 2014-09-29 MED ORDER — SODIUM CHLORIDE 0.9 % IV SOLN
500.0000 mg | Freq: Two times a day (BID) | INTRAVENOUS | Status: DC
  Administered 2014-09-30: 500 mg via INTRAVENOUS
  Filled 2014-09-29 (×4): qty 5

## 2014-09-29 MED ORDER — FOLIC ACID 1 MG PO TABS: 2.0000 mg | ORAL_TABLET | Freq: Every day | ORAL | Status: DC

## 2014-09-29 MED ORDER — I-VITE PO TABS: 1.0000 | ORAL_TABLET | Freq: Every day | ORAL | Status: DC

## 2014-09-29 MED ORDER — LEVOTHYROXINE SODIUM 25 MCG PO TABS
137.0000 ug | ORAL_TABLET | Freq: Every day | ORAL | Status: DC
  Administered 2014-09-30: 137 ug via ORAL
  Filled 2014-09-29: qty 1

## 2014-09-29 MED ORDER — HEPARIN SODIUM (PORCINE) 5000 UNIT/ML IJ SOLN
5000.0000 [IU] | Freq: Three times a day (TID) | INTRAMUSCULAR | Status: DC
  Administered 2014-09-29 – 2014-09-30 (×3): 5000 [IU] via SUBCUTANEOUS
  Filled 2014-09-29 (×3): qty 1

## 2014-09-29 MED ORDER — OCUVITE-LUTEIN PO CAPS
1.0000 | ORAL_CAPSULE | Freq: Every day | ORAL | Status: DC
  Administered 2014-09-30: 1 via ORAL
  Filled 2014-09-29: qty 1

## 2014-09-29 MED ORDER — CLOPIDOGREL BISULFATE 75 MG PO TABS
75.0000 mg | ORAL_TABLET | Freq: Every day | ORAL | Status: DC
  Administered 2014-09-29: 75 mg via ORAL

## 2014-09-29 MED ORDER — PRESERVISION AREDS PO CAPS: 1.0000 | ORAL_CAPSULE | Freq: Every day | ORAL | Status: DC

## 2014-09-29 MED ORDER — POTASSIUM CHLORIDE IN NACL 40-0.9 MEQ/L-% IV SOLN
INTRAVENOUS | Status: DC
  Administered 2014-09-29: 75 mL/h via INTRAVENOUS
  Filled 2014-09-29 (×4): qty 1000

## 2014-09-29 MED ORDER — ASPIRIN 81 MG PO CHEW
CHEWABLE_TABLET | ORAL | Status: AC
  Filled 2014-09-29: qty 4

## 2014-09-29 MED ORDER — ASPIRIN EC 325 MG PO TBEC
325.0000 mg | DELAYED_RELEASE_TABLET | Freq: Every day | ORAL | Status: DC
  Filled 2014-09-29: qty 1

## 2014-09-29 MED ORDER — LISINOPRIL 10 MG PO TABS
10.0000 mg | ORAL_TABLET | Freq: Every day | ORAL | Status: DC
  Administered 2014-09-30: 10 mg via ORAL
  Filled 2014-09-29: qty 1

## 2014-09-29 MED ORDER — CLOPIDOGREL BISULFATE 75 MG PO TABS
ORAL_TABLET | ORAL | Status: AC
Start: 2014-09-29 — End: 2014-09-30
  Filled 2014-09-29: qty 1

## 2014-09-29 MED ORDER — ASPIRIN 81 MG PO CHEW
324.0000 mg | CHEWABLE_TABLET | Freq: Once | ORAL | Status: AC
  Administered 2014-09-29: 324 mg via ORAL

## 2014-09-29 MED ORDER — ACETAMINOPHEN 500 MG PO TABS: 1000.0000 mg | ORAL_TABLET | Freq: Four times a day (QID) | ORAL | Status: DC | PRN

## 2014-09-29 MED ORDER — ASPIRIN EC 81 MG PO TBEC: 81.0000 mg | DELAYED_RELEASE_TABLET | Freq: Every day | ORAL | Status: DC

## 2014-09-29 NOTE — H&P (Signed)
History and Physical    Gregory Weaver ZOX:[id number] DOB: January 30, 1941 DOA: 09/29/2014  Referring physician: Dr. Cyril Loosen PCP: Marisue Ivan, MD  Specialists: none  Chief Complaint: Right sided weakness with expressive aphasia  HPI: [name] is a 74 y.o. male has a past medical history significant for TIA's, HTN, hyperlipidemia now with recurrent right-sided numbness associated with expressive aphasia. Had an episode yesterday lasting 30 minutes. Had recurrent sx's today lasting . Currently back to baseline. Some HA. No fever. On ECASA and Plavix.  Review of Systems: The patient denies anorexia, fever, weight loss,, vision loss, decreased hearing, hoarseness, chest pain, syncope, dyspnea on exertion, peripheral edema, balance deficits, hemoptysis, abdominal pain, melena, hematochezia, severe indigestion/heartburn, hematuria, incontinence, genital sores, muscle weakness, suspicious skin lesions, transient blindness, difficulty walking, depression, unusual weight change, abnormal bleeding, enlarged lymph nodes, angioedema, and breast masses.   Past Medical History  Diagnosis Date  . TIA (transient ischemic attack)   . Hypertension   . Hypothyroid   . Cataract   . Anemia   . Arthritis    Past Surgical History  Procedure Laterality Date  . Cataract extraction     Social History:  reports that he has quit smoking. He has never used smokeless tobacco. He reports that he does not drink alcohol or use illicit drugs.  Allergies  Allergen Reactions  . Codeine Nausea And Vomiting  . Guaifenesin & Derivatives     Chest pain    History reviewed. No pertinent family history.  Prior to Admission medications   Medication Sig Start Date End Date Taking? Authorizing Provider  acetaminophen (TYLENOL) 500 MG tablet Take 1,000 mg by mouth every 6 (six) hours as needed for mild pain.    Yes Historical Provider, MD  aspirin EC 325 MG tablet Take 325 mg by mouth daily.    Yes Historical Provider, MD  atorvastatin (LIPITOR) 10 MG tablet Take 0.5 tablets (5 mg total) by mouth daily at 6 PM. Patient taking differently: Take 10 mg by mouth daily with supper.  04/01/14  Yes Penny Pia, MD  Cholecalciferol 1000 UNITS tablet Take 1,000 Units by mouth daily.   Yes Historical Provider, MD  clopidogrel (PLAVIX) 75 MG tablet Take 75 mg by mouth daily.   Yes Historical Provider, MD  ferrous sulfate 325 (65 FE) MG tablet Take 325 mg by mouth daily with lunch.   Yes Historical Provider, MD  fluticasone (FLONASE) 50 MCG/ACT nasal spray Place 2 sprays into both nostrils daily.    Yes Historical Provider, MD  folic acid (FOLVITE) 1 MG tablet Take 2 mg by mouth daily with supper.    Yes Historical Provider, MD  levothyroxine (SYNTHROID, LEVOTHROID) 137 MCG tablet Take 137 mcg by mouth daily before breakfast.   Yes Historical Provider, MD  lisinopril (PRINIVIL,ZESTRIL) 10 MG tablet Take 10 mg by mouth daily.   Yes Historical Provider, MD  methotrexate (RHEUMATREX) 2.5 MG tablet Take 10 mg by mouth once a week. Caution:Chemotherapy. Protect from light. Every Thursday   Yes Historical Provider, MD  Multiple Vitamins-Minerals (PRESERVISION AREDS) CAPS Take 1 capsule by mouth daily.   Yes Historical Provider, MD  Omega-3 Fatty Acids (SALMON OIL-1000 PO) Take 2 capsules by mouth 2 (two) times daily.   Yes Historical Provider, MD  omeprazole (PRILOSEC) 20 MG capsule Take 20 mg by mouth daily.   Yes Historical Provider, MD  vitamin B-12 (CYANOCOBALAMIN) 1000 MCG tablet Take 1,000 mcg by mouth daily.   Yes Historical Provider, MD  Physical Exam: Filed Vitals:   09/29/14 1600 09/29/14 1615 09/29/14 1617 09/29/14 1630  BP: 175/75 198/72  189/70  Pulse: 68   67  Temp:   97.9 F (36.6 C)   TempSrc:      Resp: 22 17  14   Height:      Weight:      SpO2: 98%   100%     General:  No apparent distress  Eyes: PERRL, EOMI, no scleral icterus  ENT: moist oropharynx  Neck: supple, no  lymphadenopathy  Cardiovascular: regular rate without MRG; 2+ peripheral pulses, no JVD, no peripheral edema  Respiratory: CTA biL, good air movement without wheezing, rhonchi or crackled  Abdomen: soft, non tender to palpation, positive bowel sounds, no guarding, no rebound  Skin: no rashes  Musculoskeletal: normal bulk and tone, no joint swelling  Psychiatric: normal mood and affect  Neurologic: CN 2-12 grossly intact, MS 5/5 in all 4  Labs on Admission:  Basic Metabolic Panel:  Recent Labs Lab 09/28/14 1214 09/29/14 1455  NA 137 139  K 3.4* 3.2*  CL 104 104  CO2 26 26  GLUCOSE 139* 151*  BUN 21* 20  CREATININE 1.02 1.07  CALCIUM 8.4* 8.6*   Liver Function Tests:  Recent Labs Lab 09/28/14 1214 09/29/14 1455  AST 20 21  ALT 15* 17  ALKPHOS 66 72  BILITOT  --  0.5  PROT 6.7 7.1  ALBUMIN 3.8 4.0   No results for input(s): LIPASE, AMYLASE in the last 168 hours. No results for input(s): AMMONIA in the last 168 hours. CBC:  Recent Labs Lab 09/28/14 1214 09/29/14 1455  WBC 6.5 5.5  NEUTROABS 4.8 3.9  HGB 10.8* 11.3*  HCT 32.3* 33.1*  MCV 88 87.2  PLT 199 214   Cardiac Enzymes:  Recent Labs Lab 09/29/14 1455  TROPONINI <0.03    BNP (last 3 results) No results for input(s): BNP in the last 8760 hours.  ProBNP (last 3 results) No results for input(s): PROBNP in the last 8760 hours.  CBG: No results for input(s): GLUCAP in the last 168 hours.  Radiological Exams on Admission: Ct Head Wo Contrast  09/29/2014   CLINICAL DATA:  Slurred speech  EXAM: CT HEAD WITHOUT CONTRAST  TECHNIQUE: Contiguous axial images were obtained from the base of the skull through the vertex without intravenous contrast.  COMPARISON:  09/28/2014  FINDINGS: No evidence of parenchymal hemorrhage or extra-axial fluid collection. No mass lesion, mass effect, or midline shift.  No CT evidence of acute infarction.  Subcortical white matter and periventricular small vessel ischemic  changes. Mild intracranial atherosclerosis.  Age related atrophy.  No ventriculomegaly.  The visualized paranasal sinuses are essentially clear. The mastoid air cells are unopacified.  No evidence of calvarial fracture.  IMPRESSION: No evidence of acute intracranial abnormality.  No interval change from recent CT.  Atrophy with small vessel ischemic changes.   Electronically Signed   By: Charline BillsSriyesh  Krishnan M.D.   On: 09/29/2014 14:45   Ct Head Limited W/o Cm  09/28/2014   CLINICAL DATA:  Speech difficulty.  History of TIAs.  EXAM: CT HEAD WITHOUT CONTRAST  TECHNIQUE: Contiguous axial images were obtained from the base of the skull through the vertex without intravenous contrast.  COMPARISON:  04/18/2014; 04/05/2014; 06/05/2010  FINDINGS: Similar findings of atrophy with sulcal prominence and centralized volume loss with commensurate ex vacuo dilatation of the ventricular system. Scattered periventricular hypodensities compatible with microvascular ischemic disease. Bilateral basal ganglial calcifications, right greater  than left. No CT evidence of superimposed acute large territory infarct. No intraparenchymal or extra-axial mass or hemorrhage. Unchanged size and configuration of the ventricles and basilar cisterns. No midline shift. Intracranial atherosclerosis.  There is under pneumatization of the right frontal sinus. The remaining paranasal sinuses and mastoid air cells are normally aerated. No air-fluid levels. Regional soft tissues appear normal. No displaced calvarial fracture.  IMPRESSION: Similar findings of atrophy and microvascular ischemic disease without acute intracranial process.   Electronically Signed   By: Simonne Come M.D.   On: 09/28/2014 12:29    EKG: Independently reviewed.  Assessment/Plan Principal Problem:   Brain TIA   Will admit to floor with ASA/Plavix/SQ Heparin. Consult Neuro. Order MRI of brain with EEG and carotid US. Neuro checks q4h. F/u labs in Am. Continue outpatient  meds.  Diet: soft  Fluids: ordered DVT Prophylaxis: ordered  Code Status: FULL  Family Communication: yes  Disposition Plan: Home  Time spent: 50 min

## 2014-09-29 NOTE — ED Notes (Signed)
Patient resting in bed. No acute distress noted. Respirations equal and unlabored.   

## 2014-09-29 NOTE — Consult Note (Signed)
CC: R sided weakness  HPI: Gregory Weaver is an 74 y.o. male  has a past medical history significant for TIA's, HTN, hyperlipidemia now with recurrent right-sided numbness associated with expressive aphasia. Had an episode yesterday lasting 30 minutes. Had recurrent sx's today lasting one hour. Currently back to baseline. Some HA. No fever. On ASA and Plavix. S/P work up in [employer] in November.    Past Medical History  Diagnosis Date  . TIA (transient ischemic attack)   . Hypertension   . Hypothyroid   . Cataract   . Anemia   . Arthritis     Past Surgical History  Procedure Laterality Date  . Cataract extraction      History reviewed. No pertinent family history.  Social History:  reports that he has quit smoking. He has never used smokeless tobacco. He reports that he does not drink alcohol or use illicit drugs.  Allergies  Allergen Reactions  . Codeine Nausea And Vomiting  . Guaifenesin & Derivatives     Chest pain    Medications: I have reviewed the patient's current medications.  ROS: History obtained from the patient  General ROS: negative for - chills, fatigue, fever, night sweats, weight gain or weight loss Psychological ROS: negative for - behavioral disorder, hallucinations, memory difficulties, mood swings or suicidal ideation Ophthalmic ROS: negative for - blurry vision, double vision, eye pain or loss of vision ENT ROS: negative for - epistaxis, nasal discharge, oral lesions, sore throat, tinnitus or vertigo Allergy and Immunology ROS: negative for - hives or itchy/watery eyes Hematological and Lymphatic ROS: negative for - bleeding problems, bruising or swollen lymph nodes Endocrine ROS: negative for - galactorrhea, hair pattern changes, polydipsia/polyuria or temperature intolerance Respiratory ROS: negative for - cough, hemoptysis, shortness of breath or wheezing Cardiovascular ROS: negative for - chest pain, dyspnea on exertion, edema or irregular  heartbeat Gastrointestinal ROS: negative for - abdominal pain, diarrhea, hematemesis, nausea/vomiting or stool incontinence Genito-Urinary ROS: negative for - dysuria, hematuria, incontinence or urinary frequency/urgency Musculoskeletal ROS: negative for - joint swelling or muscular weakness Neurological ROS: as noted in HPI Dermatological ROS: negative for rash and skin lesion changes  Physical Examination: Blood pressure 189/70, pulse 67, temperature 97.9 F (36.6 C), temperature source Oral, resp. rate 14, height  (1.702 m), weight 70.308 kg (155 lb), SpO2 100 %.  Neurological Examination Mental Status: Alert, oriented, thought content appropriate.  Speech fluent without evidence of aphasia.  Able to follow 3 step commands without difficulty. Cranial Nerves: II: Discs flat bilaterally; Visual fields grossly normal, pupils equal, round, reactive to light and accommodation III,IV, VI: ptosis not present, extra-ocular motions intact bilaterally V,VII: smile symmetric, facial light touch sensation normal bilaterally VIII: hearing normal bilaterally IX,X: gag reflex present XI: bilateral shoulder shrug XII: midline tongue extension Motor: Right : Upper extremity   5/5    Left:     Upper extremity   5/5  Lower extremity   5/5     Lower extremity   5/5 Tone and bulk:normal tone throughout; no atrophy noted Sensory: Pinprick and light touch intact throughout, bilaterally Deep Tendon Reflexes: 2+ and symmetric throughout Plantars: Right: downgoing   Left: downgoing Cerebellar: normal finger-to-nose, normal rapid alternating movements and normal heel-to-shin test Gait: normal gait and station      Laboratory Studies:   Basic Metabolic Panel:  Recent Labs Lab 09/28/14 1214 09/29/14 1455  NA 137 139  K 3.4* 3.2*  CL 104 104  CO2 26 26  GLUCOSE 139* 151*  BUN 21* 20  CREATININE 1.02 1.07  CALCIUM 8.4* 8.6*    Liver Function Tests:  Recent Labs Lab 09/28/14 1214  09/29/14 1455  AST 20 21  ALT 15* 17  ALKPHOS 66 72  BILITOT  --  0.5  PROT 6.7 7.1  ALBUMIN 3.8 4.0   No results for input(s): LIPASE, AMYLASE in the last 168 hours. No results for input(s): AMMONIA in the last 168 hours.  CBC:  Recent Labs Lab 09/28/14 1214 09/29/14 1455  WBC 6.5 5.5  NEUTROABS 4.8 3.9  HGB 10.8* 11.3*  HCT 32.3* 33.1*  MCV 88 87.2  PLT 199 214    Cardiac Enzymes:  Recent Labs Lab 09/29/14 1455  TROPONINI <0.03    BNP: Invalid input(s): POCBNP  CBG: No results for input(s): GLUCAP in the last 168 hours.  Microbiology: No results found for this or any previous visit.  Coagulation Studies:  Recent Labs  09/28/14 1214 09/29/14 1455  LABPROT 13.9 13.5  INR 1.1 1.01    Urinalysis:  Recent Labs Lab 09/29/14 1418  COLORURINE YELLOW*  LABSPEC 1.015  PHURINE 6.0  GLUCOSEU NEGATIVE  HGBUR NEGATIVE  BILIRUBINUR NEGATIVE  KETONESUR NEGATIVE  PROTEINUR 30*  NITRITE NEGATIVE  LEUKOCYTESUR NEGATIVE    Lipid Panel:     Component Value Date/Time   CHOL 161 03/31/2014 0715   TRIG 141 03/31/2014 0715   HDL 34* 03/31/2014 0715   CHOLHDL 4.7 03/31/2014 0715   VLDL 28 03/31/2014 0715   LDLCALC 99 03/31/2014 0715    HgbA1C:  Lab Results  Component Value Date   HGBA1C 5.6 03/31/2014    Urine Drug Screen:  No results found for: LABOPIA, COCAINSCRNUR, LABBENZ, AMPHETMU, THCU, LABBARB  Alcohol Level: No results for input(s): ETH in the last 168 hours.    Imaging: Ct Head Wo Contrast  09/29/2014   CLINICAL DATA:  Slurred speech  EXAM: CT HEAD WITHOUT CONTRAST  TECHNIQUE: Contiguous axial images were obtained from the base of the skull through the vertex without intravenous contrast.  COMPARISON:  09/28/2014  FINDINGS: No evidence of parenchymal hemorrhage or extra-axial fluid collection. No mass lesion, mass effect, or midline shift.  No CT evidence of acute infarction.  Subcortical white matter and periventricular small vessel  ischemic changes. Mild intracranial atherosclerosis.  Age related atrophy.  No ventriculomegaly.  The visualized paranasal sinuses are essentially clear. The mastoid air cells are unopacified.  No evidence of calvarial fracture.  IMPRESSION: No evidence of acute intracranial abnormality.  No interval change from recent CT.  Atrophy with small vessel ischemic changes.   Electronically Signed   By: Charline Bills M.D.   On: 09/29/2014 14:45   Ct Head Limited W/o Cm  09/28/2014   CLINICAL DATA:  Speech difficulty.  History of TIAs.  EXAM: CT HEAD WITHOUT CONTRAST  TECHNIQUE: Contiguous axial images were obtained from the base of the skull through the vertex without intravenous contrast.  COMPARISON:  04/18/2014; 04/05/2014; 06/05/2010  FINDINGS: Similar findings of atrophy with sulcal prominence and centralized volume loss with commensurate ex vacuo dilatation of the ventricular system. Scattered periventricular hypodensities compatible with microvascular ischemic disease. Bilateral basal ganglial calcifications, right greater than left. No CT evidence of superimposed acute large territory infarct. No intraparenchymal or extra-axial mass or hemorrhage. Unchanged size and configuration of the ventricles and basilar cisterns. No midline shift. Intracranial atherosclerosis.  There is under pneumatization of the right frontal sinus. The remaining paranasal sinuses and mastoid air cells are  normally aerated. No air-fluid levels. Regional soft tissues appear normal. No displaced calvarial fracture.  IMPRESSION: Similar findings of atrophy and microvascular ischemic disease without acute intracranial process.   Electronically Signed   By: Simonne ComeJohn  Watts M.D.   On: 09/28/2014 12:29     Assessment/Plan: 74 y.o. male has a past medical history significant for TIA's, HTN, hyperlipidemia now with recurrent right-sided numbness associated with expressive aphasia. Had an episode yesterday lasting 30 minutes. Had recurrent  sx's today lasting one hour.    I am not convinced this is vascular in nature.  At times pt has weakness in LUE and then RUE. - pt ordered MRI and carotid dopplers - Will add MRA of head  - EEG tomorrow - I will start on Keppra because diff vascular territories and suspect seizure activity - at time speech involved so less likely cervical in nature - con't dual anti plt.  - if work up negative would f/up with neurology to make sure this is not a neuromuscular problem by doing nerve conduction/emg.   Pauletta BrownsZEYLIKMAN, Arda Daggs

## 2014-09-29 NOTE — ED Notes (Signed)
Patient to ED with report of slurred speech and difficulty getting words out that started at 1315.

## 2014-09-29 NOTE — ED Provider Notes (Signed)
[facility] Emergency Department Provider Note    ____________________________________________  Time seen: 2:25 PM  I have reviewed the triage vital signs and the nursing notes.   HISTORY  Chief Complaint Cerebrovascular Accident       HPI Gregory Weaver is a 74 y.o. male who presents with strokelike symptoms. He states that at 1:45 PM he developed right hand and arm numbness and his daughter reports that he had difficulty speaking. The symptoms have completely resolved at this time. Apparently this happened yesterday as well. But today it lasted longer. Patient also notes that he had a mild headache during the time.     Past Medical History  Diagnosis Date  . TIA (transient ischemic attack)   . Hypertension   . Hypothyroid   . Cataract   . Anemia   . Arthritis     Patient Active Problem List   Diagnosis Date Noted  . Stroke   . GERD (gastroesophageal reflux disease) 04/01/2014  . Hypothyroidism 04/01/2014  . Essential hypertension 04/01/2014  . TIA (transient ischemic attack) 03/30/2014    Past Surgical History  Procedure Laterality Date  . Cataract extraction      Current Outpatient Rx  Name  Route  Sig  Dispense  Refill  . acetaminophen (TYLENOL) 500 MG tablet   Oral   Take 500 mg by mouth every 6 (six) hours as needed for mild pain.         Marland Kitchen atorvastatin (LIPITOR) 10 MG tablet   Oral   Take 0.5 tablets (5 mg total) by mouth daily at 6 PM.   30 tablet   0   . Cholecalciferol (VITAMIN D-3) 1000 UNITS CAPS   Oral   Take 1 capsule by mouth daily.         . clopidogrel (PLAVIX) 75 MG tablet   Oral   Take 75 mg by mouth daily.         . ferrous sulfate 325 (65 FE) MG tablet   Oral   Take 325 mg by mouth daily with lunch.         . fluticasone (FLONASE) 50 MCG/ACT nasal spray   Each Nare   Place 2 sprays into both nostrils daily as needed for allergies or rhinitis.         . folic acid (FOLVITE) 1 MG  tablet   Oral   Take 1 mg by mouth daily.         Marland Kitchen levothyroxine (SYNTHROID, LEVOTHROID) 137 MCG tablet   Oral   Take 137 mcg by mouth daily before breakfast.         . methotrexate (RHEUMATREX) 2.5 MG tablet   Oral   Take 10 mg by mouth once a week. Caution:Chemotherapy. Protect from light. Every Thursday         . Omega-3 Fatty Acids (SALMON OIL-1000 PO)   Oral   Take 2 capsules by mouth 2 (two) times daily.         Marland Kitchen omeprazole (PRILOSEC) 20 MG capsule   Oral   Take 20 mg by mouth daily.         . vitamin B-12 (CYANOCOBALAMIN) 1000 MCG tablet   Oral   Take 1,000 mcg by mouth daily.           Allergies Codeine and Guaifenesin & derivatives  No family history on file.  Social History History  Substance Use Topics  . Smoking status: Former Games developer  . Smokeless tobacco: Never Used  .  Alcohol Use: No    Review of Systems  Constitutional: Negative for fever. Eyes: Negative for visual changes. ENT: Negative for sore throat. Cardiovascular: Negative for chest pain. Respiratory: Negative for shortness of breath. Gastrointestinal: Negative for abdominal pain, vomiting and diarrhea. Genitourinary: Negative for dysuria. Musculoskeletal: Negative for pain. Skin: Negative for rash. Neurological: Patient did have a headache which is now resolved. He did have numbness in the right upper extremity which is now resolved.   10-point ROS otherwise negative.  ____________________________________________   PHYSICAL EXAM:  VITAL SIGNS: ED Triage Vitals  Enc Vitals Group     BP 09/29/14 1421 193/88 mmHg     Pulse Rate 09/29/14 1424 73     Resp 09/29/14 1424 24     Temp 09/29/14 1424 98 F (36.7 C)     Temp Source 09/29/14 1424 Oral     SpO2 09/29/14 1424 98 %     Weight 09/29/14 1424 155 lb (70.308 kg)     Height 09/29/14 1424 5\' 7"  (1.702 m)     Head Cir --      Peak Flow --      Pain Score --      Pain Loc --      Pain Edu? --      Excl. in GC?  --      Constitutional: Alert and oriented. Well appearing and in no distress. Eyes: Conjunctivae are normal. PERRL. Normal extraocular movements. ENT   Head: Normocephalic and atraumatic.   Nose: No congestion/rhinnorhea.   Mouth/Throat: Mucous membranes are moist.   Neck: No stridor. Hematological/Lymphatic/Immunilogical: No cervical lymphadenopathy. Cardiovascular: Normal rate, regular rhythm. Normal and symmetric distal pulses are present in all extremities. No murmurs, rubs, or gallops. Respiratory: Normal respiratory effort without tachypnea nor retractions. Breath sounds are clear and equal bilaterally. No wheezes/rales/rhonchi. Gastrointestinal: Soft and nontender. No distention. No abdominal bruits. There is no CVA tenderness. Genitourinary: Deferred Musculoskeletal: Nontender with normal range of motion in all extremities. No joint effusions.  No lower extremity tenderness nor edema. Neurologic:  Normal speech and language. No gross focal neurologic deficits are appreciated. Speech is normal. No gait instability. Entirely normal neurologic exam Skin:  Skin is warm, dry and intact. No rash noted. Psychiatric: Mood and affect are normal. Speech and behavior are normal. Patient exhibits appropriate insight and judgment.  ____________________________________________   EKG  ED ECG REPORT   Date: 09/29/2014  EKG Time: 2:54 PM  Rate: 72  Rhythm: normal sinus rhythm  Axis: Left axis deviation  Intervals:none  ST&T Change: None   ____________________________________________    RADIOLOGY  CT head noted is normal  ____________________________________________   PROCEDURES  Procedure(s) performed: None  Critical Care performed: No  ____________________________________________   INITIAL IMPRESSION / ASSESSMENT AND PLAN / ED COURSE  Pertinent labs & imaging results that were available during my care of the patient were reviewed by me and considered in  my medical decision making (see chart for details).  Patient seen and immediately sent to CAT scan. Symptoms consistent with TIA. Given similar symptoms yesterday initial impression is that patient will require admission.  ----------------------------------------- 3:35 PM on 09/29/2014 -----------------------------------------  CT negative, patient's symptoms have completely resolved.  ____________________________________________ ----------------------------------------- 4:08 PM on 09/29/2014 -----------------------------------------  Discussed with Dr. Loretha BrasilZeylikman of neurology and he recommended admission. Recommends carotid Dopplers and MRI and EEG in the morning. He will see patient in the ED. Aspirin given  FINAL CLINICAL IMPRESSION(S) / ED DIAGNOSES  Final diagnoses:  Transient cerebral  ischemia, unspecified transient cerebral ischemia type     Jene Every, MD 09/29/14 1610

## 2014-09-30 ENCOUNTER — Observation Stay: Payer: 59

## 2014-09-30 DIAGNOSIS — G452 Multiple and bilateral precerebral artery syndromes: Secondary | ICD-10-CM | POA: Diagnosis not present

## 2014-09-30 LAB — CBC
HCT: 32.2 % — ABNORMAL LOW (ref 40.0–52.0)
HEMOGLOBIN: 10.9 g/dL — AB (ref 13.0–18.0)
MCH: 29.5 pg (ref 26.0–34.0)
MCHC: 33.9 g/dL (ref 32.0–36.0)
MCV: 87.1 fL (ref 80.0–100.0)
PLATELETS: 214 10*3/uL (ref 150–440)
RBC: 3.69 MIL/uL — ABNORMAL LOW (ref 4.40–5.90)
RDW: 14.4 % (ref 11.5–14.5)
WBC: 5.9 10*3/uL (ref 3.8–10.6)

## 2014-09-30 LAB — BASIC METABOLIC PANEL
Anion gap: 9 (ref 5–15)
BUN: 16 mg/dL (ref 6–20)
CALCIUM: 8.5 mg/dL — AB (ref 8.9–10.3)
CO2: 25 mmol/L (ref 22–32)
Chloride: 105 mmol/L (ref 101–111)
Creatinine, Ser: 0.86 mg/dL (ref 0.61–1.24)
GLUCOSE: 123 mg/dL — AB (ref 65–99)
POTASSIUM: 3.4 mmol/L — AB (ref 3.5–5.1)
SODIUM: 139 mmol/L (ref 135–145)

## 2014-09-30 MED ORDER — LISINOPRIL 20 MG PO TABS: 20.0000 mg | ORAL_TABLET | Freq: Every day | ORAL | Status: AC

## 2014-09-30 MED ORDER — LEVETIRACETAM 500 MG PO TABS: 500.0000 mg | ORAL_TABLET | Freq: Two times a day (BID) | ORAL | Status: AC

## 2014-09-30 NOTE — Consult Note (Signed)
CC: R sided weakness  HPI: Gregory Weaver is an 74 y.o. male  has a past medical history significant for TIA's, HTN, hyperlipidemia now with recurrent right-sided numbness associated with expressive aphasia. Had an episode yesterday lasting 30 minutes. Had recurrent sx's today lasting one hour. Currently back to baseline. Some HA. No fever. On ASA and Plavix. S/P work up in [employer] in November.    Currently back to baseline.   Past Medical History  Diagnosis Date  . TIA (transient ischemic attack)   . Hypertension   . Hypothyroid   . Cataract   . Anemia   . Arthritis     Past Surgical History  Procedure Laterality Date  . Cataract extraction      History reviewed. No pertinent family history.  Social History:  reports that he has quit smoking. He has never used smokeless tobacco. He reports that he does not drink alcohol or use illicit drugs.  Allergies  Allergen Reactions  . Codeine Nausea And Vomiting  . Guaifenesin & Derivatives     Chest pain    Medications: I have reviewed the patient's current medications.  ROS: History obtained from the patient    Physical Examination: Blood pressure 172/59, pulse 63, temperature 97.7 F (36.5 C), temperature source Oral, resp. rate 18, height  (1.702 m), weight 69.945 kg (154 lb 3.2 oz), SpO2 100 %.  Neurological Examination Mental Status: Alert, oriented, thought content appropriate.  Speech fluent without evidence of aphasia.  Able to follow 3 step commands without difficulty. Cranial Nerves: II: Discs flat bilaterally; Visual fields grossly normal, pupils equal, round, reactive to light and accommodation III,IV, VI: ptosis not present, extra-ocular motions intact bilaterally V,VII: smile symmetric, facial light touch sensation normal bilaterally VIII: hearing normal bilaterally IX,X: gag reflex present XI: bilateral shoulder shrug XII: midline tongue extension Motor: Right : Upper extremity    5/5    Left:     Upper extremity   5/5  Lower extremity   5/5     Lower extremity   5/5 Tone and bulk:normal tone throughout; no atrophy noted Sensory: Pinprick and light touch intact throughout, bilaterally Deep Tendon Reflexes: 2+ and symmetric throughout Plantars: Right: downgoing   Left: downgoing Cerebellar: normal finger-to-nose, normal rapid alternating movements and normal heel-to-shin test Gait: normal gait and station      Laboratory Studies:   Basic Metabolic Panel:  Recent Labs Lab 09/28/14 1214 09/29/14 1455 09/29/14 2029 09/30/14 0632  NA 137 139  --  139  K 3.4* 3.2*  --  3.4*  CL 104 104  --  105  CO2 26 26  --  25  GLUCOSE 139* 151*  --  123*  BUN 21* 20  --  16  CREATININE 1.02 1.07 1.02 0.86  CALCIUM 8.4* 8.6*  --  8.5*    Liver Function Tests:  Recent Labs Lab 09/28/14 1214 09/29/14 1455  AST 20 21  ALT 15* 17  ALKPHOS 66 72  BILITOT  --  0.5  PROT 6.7 7.1  ALBUMIN 3.8 4.0   No results for input(s): LIPASE, AMYLASE in the last 168 hours. No results for input(s): AMMONIA in the last 168 hours.  CBC:  Recent Labs Lab 09/28/14 1214 09/29/14 1455 09/29/14 2029 09/30/14 0632  WBC 6.5 5.5 7.9 5.9  NEUTROABS 4.8 3.9  --   --   HGB 10.8* 11.3* 11.8* 10.9*  HCT 32.3* 33.1* 34.0* 32.2*  MCV 88 87.2 87.6 87.1  PLT 199  214 224 214    Cardiac Enzymes:  Recent Labs Lab 09/29/14 1455  TROPONINI <0.03    BNP: Invalid input(s): POCBNP  CBG: No results for input(s): GLUCAP in the last 168 hours.  Microbiology: No results found for this or any previous visit.  Coagulation Studies:  Recent Labs  09/28/14 1214 09/29/14 1455  LABPROT 13.9 13.5  INR 1.1 1.01    Urinalysis:   Recent Labs Lab 09/29/14 1418  COLORURINE YELLOW*  LABSPEC 1.015  PHURINE 6.0  GLUCOSEU NEGATIVE  HGBUR NEGATIVE  BILIRUBINUR NEGATIVE  KETONESUR NEGATIVE  PROTEINUR 30*  NITRITE NEGATIVE  LEUKOCYTESUR NEGATIVE    Lipid Panel:      Component Value Date/Time   CHOL 161 03/31/2014 0715   TRIG 141 03/31/2014 0715   HDL 34* 03/31/2014 0715   CHOLHDL 4.7 03/31/2014 0715   VLDL 28 03/31/2014 0715   LDLCALC 99 03/31/2014 0715    HgbA1C:  Lab Results  Component Value Date   HGBA1C 5.6 03/31/2014    Urine Drug Screen:  No results found for: LABOPIA, COCAINSCRNUR, LABBENZ, AMPHETMU, THCU, LABBARB  Alcohol Level: No results for input(s): ETH in the last 168 hours.    Imaging: Ct Head Wo Contrast  09/29/2014   CLINICAL DATA:  Slurred speech  EXAM: CT HEAD WITHOUT CONTRAST  TECHNIQUE: Contiguous axial images were obtained from the base of the skull through the vertex without intravenous contrast.  COMPARISON:  09/28/2014  FINDINGS: No evidence of parenchymal hemorrhage or extra-axial fluid collection. No mass lesion, mass effect, or midline shift.  No CT evidence of acute infarction.  Subcortical white matter and periventricular small vessel ischemic changes. Mild intracranial atherosclerosis.  Age related atrophy.  No ventriculomegaly.  The visualized paranasal sinuses are essentially clear. The mastoid air cells are unopacified.  No evidence of calvarial fracture.  IMPRESSION: No evidence of acute intracranial abnormality.  No interval change from recent CT.  Atrophy with small vessel ischemic changes.   Electronically Signed   By: Charline Bills M.D.   On: 09/29/2014 14:45   Mr Maxine Glenn Head Wo Contrast  09/30/2014   CLINICAL DATA:  Stroke. Slurred speech with right-sided weakness and numbness  EXAM: MRI HEAD WITHOUT CONTRAST  MRA HEAD WITHOUT CONTRAST  TECHNIQUE: Multiplanar, multiecho pulse sequences of the brain and surrounding structures were obtained without intravenous contrast. Angiographic images of the head were obtained using MRA technique without contrast.  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 10 mm posterior slip of  the dens causing spinal stenosis at the craniocervical junction. This is unchanged.  Pituitary normal in size. Generalized atrophy with cerebral volume loss and mild ventricular enlargement  Negative for acute infarct  Chronic microvascular ischemic changes in the white matter are mild. 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  Negative for intracranial hemorrhage  Negative for mass or edema.  Mucosal edema in the paranasal sinuses, Mild  MRA HEAD FINDINGS  Both vertebral arteries are patent to the basilar. Basilar widely patent. Superior cerebellar and posterior cerebral arteries are patent. Prominent left AICA patent  Right internal carotid artery has mild atherosclerotic disease. Right anterior cerebral artery widely patent. Focal moderate stenosis right M1 segment. Right middle cerebral artery branches are patent.  Left cavernous carotid widely patent. Left anterior and left middle cerebral arteries widely patent  Negative for cerebral aneurysm.  IMPRESSION: Mild chronic ischemic change.  No acute infarct or mass  Moderate stenosis right M1 segment.   Electronically Signed   By: Marlan Palauharles  Clark M.D.   On: 09/30/2014 11:13   Mr Brain Wo Contrast  09/30/2014   CLINICAL DATA:  Stroke. Slurred speech with right-sided weakness and numbness  EXAM: MRI HEAD WITHOUT CONTRAST  MRA HEAD WITHOUT CONTRAST  TECHNIQUE: Multiplanar, multiecho pulse sequences of the brain and surrounding structures were obtained without intravenous contrast. Angiographic images of the head were obtained using MRA technique without contrast.  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 10 mm posterior slip of the dens causing spinal stenosis at the craniocervical junction. This is unchanged.  Pituitary normal in size. Generalized atrophy with cerebral volume loss and mild ventricular enlargement  Negative for acute  infarct  Chronic microvascular ischemic changes in the white matter are mild. 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  Negative for intracranial hemorrhage  Negative for mass or edema.  Mucosal edema in the paranasal sinuses, Mild  MRA HEAD FINDINGS  Both vertebral arteries are patent to the basilar. Basilar widely patent. Superior cerebellar and posterior cerebral arteries are patent. Prominent left AICA patent  Right internal carotid artery has mild atherosclerotic disease. Right anterior cerebral artery widely patent. Focal moderate stenosis right M1 segment. Right middle cerebral artery branches are patent.  Left cavernous carotid widely patent. Left anterior and left middle cerebral arteries widely patent  Negative for cerebral aneurysm.  IMPRESSION: Mild chronic ischemic change.  No acute infarct or mass  Moderate stenosis right M1 segment.   Electronically Signed   By: Marlan Palauharles  Clark M.D.   On: 09/30/2014 11:13   Mr Cervical Spine Wo Contrast  09/30/2014   CLINICAL DATA:  Slurred speech.  Right-sided weakness and numbness.  EXAM: MRI CERVICAL SPINE WITHOUT CONTRAST  TECHNIQUE: Multiplanar, multisequence MR imaging of the cervical spine was performed. No intravenous contrast was administered.  COMPARISON:  MRI of the brain 03/31/2014.  FINDINGS: A chronic type 2 dens fracture is present with 7 mm of posterior displacement of the C1 arch in dens relative to the remainder of the CT vertebral body. There is moderate associated central canal stenosis at the C1-2 level with focal cord signal abnormality expanding extending cephalo caudad approximately 2 cm. No other focal cord signal abnormality is present. The visualized intracranial contents are normal.  There is fusion across the disc space at C5-6. Schmorl's nodes are present at C6-7. There is reversal of the normal cervical lordosis.  Flow is present in the vertebral arteries which appear to be codominant.  C2-3: Moderate  right-sided facet hypertrophy contributes to mild right foraminal narrowing. The central disc protrusion effaces the ventral CSF. The left foramen is patent.  C3-4: A broad-based disc osteophyte complex partially effaces the ventral CSF. Mild to moderate right foraminal stenosis is due to uncovertebral and facet hypertrophy.  C4-5: A shallow central disc protrusion is present. Mild foraminal narrowing is evident bilaterally secondary to uncovertebral and facet hypertrophy.  C5-6: Asymmetric right-sided uncovertebral spurring results in moderate right foraminal stenosis. The left foramen is patent.  C6-7:  Slight disc bulging is present without significant stenosis.  C7-T1:  Negative.  IMPRESSION: 1. 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 2. Moderate to severe central  canal stenosis at the C1-2 level with cord signal changes extending 2 cm suggesting edema within the cord. 3. Mild right foraminal narrowing at C2-3. 4. Mild to moderate right foraminal stenosis at C3-4. 5. Mild foraminal narrowing bilaterally at C4-5. 6. Moderate right foraminal stenosis at C5-6. 7. Mild to moderate central canal stenosis at C2-3 and C3-4.   Electronically Signed   By: Marin Roberts M.D.   On: 09/30/2014 11:03   US Carotid Bilateral  09/29/2014   ADDENDUM REPORT: 09/29/2014 20:09  ADDENDUM: Further clarification of the findings above. For the right internal carotid artery which demonstrated a peak systolic velocity of 145 cm/sec. While this number technically falls within range of a 50-70 % stenosis based on PSV alone, the remaining measurements including end diastolic volume, ICA/CCA PSV ratio, and objective appearance are most consistent with a mild/less than 50% stenosis.   Electronically Signed   By: Rise Mu M.D.   On: 09/29/2014 20:09   09/29/2014   CLINICAL DATA:  Initial evaluation for transient ischemic  attack.  EXAM: BILATERAL CAROTID DUPLEX ULTRASOUND  TECHNIQUE: Wallace Cullens scale imaging, color Doppler and duplex ultrasound were performed of bilateral carotid and vertebral arteries in the neck.  COMPARISON:  None available.  FINDINGS: Criteria: Quantification of carotid stenosis is based on velocity parameters that correlate the residual internal carotid diameter with NASCET-based stenosis levels, using the diameter of the distal internal carotid lumen as the denominator for stenosis measurement.  The following velocity measurements were obtained:  RIGHT  ICA:  145 cm/sec  CCA:  119 cm/sec  SYSTOLIC ICA/CCA RATIO:  1.2  DIASTOLIC ICA/CCA RATIO:  1.5  ECA:  118 cm/sec  LEFT  ICA:  127 cm/sec  CCA:  84 cm/sec  SYSTOLIC ICA/CCA RATIO:  1.5  DIASTOLIC ICA/CCA RATIO:  1.5  ECA:  153 cm/sec  RIGHT CAROTID ARTERY: Antegrade flow. Small amount of plaque present at the right carotid bifurcation with less than 50% stenosis by NASCET criteria.  RIGHT VERTEBRAL ARTERY:  Antegrade flow  LEFT CAROTID ARTERY: Antegrade flow. Small amount of echogenic plaque present at the left carotid bifurcation with less than 50% stenosis by NASCET criteria  LEFT VERTEBRAL ARTERY:  Antegrade flow  IMPRESSION: Mild atheromatous plaque about the bilateral carotid bifurcations without evidence for rate limiting stenosis (less than 50% by NASCET criteria).  Electronically Signed: By: Rise Mu M.D. On: 09/29/2014 20:01     Assessment/Plan: 74 y.o. male has a past medical history significant for TIA's, HTN, hyperlipidemia now with recurrent right-sided numbness associated with expressive aphasia. Had an episode yesterday lasting 30 minutes. Had recurrent sx's today lasting one hour.   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 R M1 stenosis on MRA but pt is already on dual anti platelet.    - I don't think this is vascular. EEG no epileptiform activity but it can miss subacute  issues specifically 24 hrs post event.   - Con't Keppra 500 BID - d/c pt today - f/up Dr. Sherryll Burger as out pt - s/p discussion with pt and wife.     Pauletta Browns

## 2014-09-30 NOTE — Progress Notes (Signed)
Progress note  S: pt denies any right sided weakness, speech back to normal denies any other complaints        Review of Systems:   CONSTITUTIONAL: No documented fever. No fatigue, weakness. No weight gain, no weight loss.  EYES: No blurry or double vision.  ENT: No tinnitus. No postnasal drip. No redness of the oropharynx.  RESPIRATORY: No cough, no wheeze, no hemoptysis. No dyspnea.  CARDIOVASCULAR: No chest pain. No orthopnea. No palpitations. No syncope.  GASTROINTESTINAL: No nausea, no vomiting or diarrhea. No abdominal pain. No melena or hematochezia.  GENITOURINARY: No dysuria or hematuria.  ENDOCRINE: No polyuria or nocturia. No heat or cold intolerance.  HEMATOLOGY: No anemia. No bruising. No bleeding.  INTEGUMENTARY: No rashes. No lesions.  MUSCULOSKELETAL: No arthritis. No swelling. No gout.  NEUROLOGIC: No numbness, tingling, or ataxia. No seizure-type activity.  PSYCHIATRIC: No anxiety. No insomnia. No ADD.       Past Medical History  Diagnosis Date  . TIA (transient ischemic attack)   . Hypertension   . Hypothyroid   . Cataract   . Anemia   . Arthritis    Past Surgical History  Procedure Laterality Date  . Cataract extraction     Social History:  reports that he has quit smoking. He has never used smokeless tobacco. He reports that he does not drink alcohol or use illicit drugs.  Allergies  Allergen Reactions  . Codeine Nausea And Vomiting  . Guaifenesin & Derivatives     Chest pain    History reviewed. No pertinent family history.  Prior to Admission medications   Medication Sig Start Date End Date Taking? Authorizing Provider  acetaminophen (TYLENOL) 500 MG tablet Take 1,000 mg by mouth every 6 (six) hours as needed for mild pain.    Yes Historical Provider, MD  aspirin EC 325 MG tablet Take 325 mg by mouth daily.   Yes Historical Provider, MD  atorvastatin (LIPITOR) 10 MG tablet Take 0.5 tablets (5 mg total) by mouth daily at 6 PM. Patient  taking differently: Take 10 mg by mouth daily with supper.  04/01/14  Yes Penny Pia, MD  Cholecalciferol 1000 UNITS tablet Take 1,000 Units by mouth daily.   Yes Historical Provider, MD  clopidogrel (PLAVIX) 75 MG tablet Take 75 mg by mouth daily.   Yes Historical Provider, MD  ferrous sulfate 325 (65 FE) MG tablet Take 325 mg by mouth daily with lunch.   Yes Historical Provider, MD  fluticasone (FLONASE) 50 MCG/ACT nasal spray Place 2 sprays into both nostrils daily.    Yes Historical Provider, MD  folic acid (FOLVITE) 1 MG tablet Take 2 mg by mouth daily with supper.    Yes Historical Provider, MD  levothyroxine (SYNTHROID, LEVOTHROID) 137 MCG tablet Take 137 mcg by mouth daily before breakfast.   Yes Historical Provider, MD  lisinopril (PRINIVIL,ZESTRIL) 10 MG tablet Take 10 mg by mouth daily.   Yes Historical Provider, MD  methotrexate (RHEUMATREX) 2.5 MG tablet Take 10 mg by mouth once a week. Caution:Chemotherapy. Protect from light. Every Thursday   Yes Historical Provider, MD  Multiple Vitamins-Minerals (PRESERVISION AREDS) CAPS Take 1 capsule by mouth daily.   Yes Historical Provider, MD  Omega-3 Fatty Acids (SALMON OIL-1000 PO) Take 2 capsules by mouth 2 (two) times daily.   Yes Historical Provider, MD  omeprazole (PRILOSEC) 20 MG capsule Take 20 mg by mouth daily.   Yes Historical Provider, MD  vitamin B-12 (CYANOCOBALAMIN) 1000 MCG tablet Take 1,000 mcg  by mouth daily.   Yes Historical Provider, MD   Physical Exam: Filed Vitals:   09/29/14 1920 09/29/14 1957 09/30/14 0535 09/30/14 1142  BP: 187/66  163/57 172/59  Pulse: 68  64 63  Temp: 97.9 F (36.6 C)  97.7 F (36.5 C) 97.7 F (36.5 C)  TempSrc: Oral  Oral Oral  Resp: Height:      Weight:  69.945 kg (154 lb 3.2 oz)    SpO2: 100%  97% 100%     General:  No apparent distress  Eyes: PERRL, EOMI, no scleral icterus  ENT: moist oropharynx  Neck: supple, no lymphadenopathy  Cardiovascular: regular rate  without MRG; 2+ peripheral pulses, no JVD, no peripheral edema  Respiratory: CTA biL, good air movement without wheezing, rhonchi or crackled  Abdomen: soft, non tender to palpation, positive bowel sounds, no guarding, no rebound  Skin: no rashes  Musculoskeletal: normal bulk and tone, no joint swelling  Psychiatric: normal mood and affect  Neurologic: CN 2-12 grossly intact, MS 5/5 in all 4  Labs on Admission:  Basic Metabolic Panel:  Recent Labs Lab 09/28/14 1214 09/29/14 1455 09/29/14 2029 09/30/14 0632  NA 137 139  --  139  K 3.4* 3.2*  --  3.4*  CL 104 104  --  105  CO2 26 26  --  25  GLUCOSE 139* 151*  --  123*  BUN 21* 20  --  16  CREATININE 1.02 1.07 1.02 0.86  CALCIUM 8.4* 8.6*  --  8.5*   Liver Function Tests:  Recent Labs Lab 09/28/14 1214 09/29/14 1455  AST 20 21  ALT 15* 17  ALKPHOS 66 72  BILITOT  --  0.5  PROT 6.7 7.1  ALBUMIN 3.8 4.0   No results for input(s): LIPASE, AMYLASE in the last 168 hours. No results for input(s): AMMONIA in the last 168 hours. CBC:  Recent Labs Lab 09/28/14 1214 09/29/14 1455 09/29/14 2029 09/30/14 0632  WBC 6.5 5.5 7.9 5.9  NEUTROABS 4.8 3.9  --   --   HGB 10.8* 11.3* 11.8* 10.9*  HCT 32.3* 33.1* 34.0* 32.2*  MCV 88 87.2 87.6 87.1  PLT 199 214 224 214   Cardiac Enzymes:  Recent Labs Lab 09/29/14 1455  TROPONINI <0.03    BNP (last 3 results) No results for input(s): BNP in the last 8760 hours.  ProBNP (last 3 results) No results for input(s): PROBNP in the last 8760 hours.  CBG: No results for input(s): GLUCAP in the last 168 hours.  Radiological Exams on Admission: Ct Head Wo Contrast  09/29/2014   CLINICAL DATA:  Slurred speech  EXAM: CT HEAD WITHOUT CONTRAST  TECHNIQUE: Contiguous axial images were obtained from the base of the skull through the vertex without intravenous contrast.  COMPARISON:  09/28/2014  FINDINGS: No evidence of parenchymal hemorrhage or extra-axial fluid collection. No  mass lesion, mass effect, or midline shift.  No CT evidence of acute infarction.  Subcortical white matter and periventricular small vessel ischemic changes. Mild intracranial atherosclerosis.  Age related atrophy.  No ventriculomegaly.  The visualized paranasal sinuses are essentially clear. The mastoid air cells are unopacified.  No evidence of calvarial fracture.  IMPRESSION: No evidence of acute intracranial abnormality.  No interval change from recent CT.  Atrophy with small vessel ischemic changes.   Electronically Signed   By: Charline Bills M.D.   On: 09/29/2014 14:45   Mr Maxine Glenn Head Wo Contrast  09/30/2014  CLINICAL DATA:  Stroke. Slurred speech with right-sided weakness and numbness  EXAM: MRI HEAD WITHOUT CONTRAST  MRA HEAD WITHOUT CONTRAST  TECHNIQUE: Multiplanar, multiecho pulse sequences of the brain and surrounding structures were obtained without intravenous contrast. Angiographic images of the head were obtained using MRA technique without contrast.  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 10 mm posterior slip of the dens causing spinal stenosis at the craniocervical junction. This is unchanged.  Pituitary normal in size. Generalized atrophy with cerebral volume loss and mild ventricular enlargement  Negative for acute infarct  Chronic microvascular ischemic changes in the white matter are mild. 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  Negative for intracranial hemorrhage  Negative for mass or edema.  Mucosal edema in the paranasal sinuses, Mild  MRA HEAD FINDINGS  Both vertebral arteries are patent to the basilar. Basilar widely patent. Superior cerebellar and posterior cerebral arteries are patent. Prominent left AICA patent  Right internal carotid artery has mild atherosclerotic disease. Right anterior cerebral artery widely patent. Focal moderate stenosis  right M1 segment. Right middle cerebral artery branches are patent.  Left cavernous carotid widely patent. Left anterior and left middle cerebral arteries widely patent  Negative for cerebral aneurysm.  IMPRESSION: Mild chronic ischemic change.  No acute infarct or mass  Moderate stenosis right M1 segment.   Electronically Signed   By: Marlan Palau M.D.   On: 09/30/2014 11:13   Mr Brain Wo Contrast  09/30/2014   CLINICAL DATA:  Stroke. Slurred speech with right-sided weakness and numbness  EXAM: MRI HEAD WITHOUT CONTRAST  MRA HEAD WITHOUT CONTRAST  TECHNIQUE: Multiplanar, multiecho pulse sequences of the brain and surrounding structures were obtained without intravenous contrast. Angiographic images of the head were obtained using MRA technique without contrast.  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 10 mm posterior slip of the dens causing spinal stenosis at the craniocervical junction. This is unchanged.  Pituitary normal in size. Generalized atrophy with cerebral volume loss and mild ventricular enlargement  Negative for acute infarct  Chronic microvascular ischemic changes in the white matter are mild. 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  Negative for intracranial hemorrhage  Negative for mass or edema.  Mucosal edema in the paranasal sinuses, Mild  MRA HEAD FINDINGS  Both vertebral arteries are patent to the basilar. Basilar widely patent. Superior cerebellar and posterior cerebral arteries are patent. Prominent left AICA patent  Right internal carotid artery has mild atherosclerotic disease. Right anterior cerebral artery widely patent. Focal moderate stenosis right M1 segment. Right middle cerebral artery branches are patent.  Left cavernous carotid widely patent. Left anterior and left middle cerebral arteries widely patent  Negative for cerebral aneurysm.   IMPRESSION: Mild chronic ischemic change.  No acute infarct or mass  Moderate stenosis right M1 segment.   Electronically Signed   By: Marlan Palau M.D.   On: 09/30/2014 11:13   Mr Cervical Spine Wo Contrast  09/30/2014   CLINICAL DATA:  Slurred speech.  Right-sided weakness and numbness.  EXAM: MRI CERVICAL SPINE WITHOUT CONTRAST  TECHNIQUE: Multiplanar, multisequence MR imaging of the cervical spine was performed. No intravenous contrast was administered.  COMPARISON:  MRI of the brain 03/31/2014.  FINDINGS: A chronic type 2 dens fracture is  present with 7 mm of posterior displacement of the C1 arch in dens relative to the remainder of the CT vertebral body. There is moderate associated central canal stenosis at the C1-2 level with focal cord signal abnormality expanding extending cephalo caudad approximately 2 cm. No other focal cord signal abnormality is present. The visualized intracranial contents are normal.  There is fusion across the disc space at C5-6. Schmorl's nodes are present at C6-7. There is reversal of the normal cervical lordosis.  Flow is present in the vertebral arteries which appear to be codominant.  C2-3: Moderate right-sided facet hypertrophy contributes to mild right foraminal narrowing. The central disc protrusion effaces the ventral CSF. The left foramen is patent.  C3-4: A broad-based disc osteophyte complex partially effaces the ventral CSF. Mild to moderate right foraminal stenosis is due to uncovertebral and facet hypertrophy.  C4-5: A shallow central disc protrusion is present. Mild foraminal narrowing is evident bilaterally secondary to uncovertebral and facet hypertrophy.  C5-6: Asymmetric right-sided uncovertebral spurring results in moderate right foraminal stenosis. The left foramen is patent.  C6-7:  Slight disc bulging is present without significant stenosis.  C7-T1:  Negative.  IMPRESSION: 1. 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 2. 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 3. Mild right foraminal narrowing at C2-3. 4. Mild to moderate right foraminal stenosis at C3-4. 5. Mild foraminal narrowing bilaterally at C4-5. 6. Moderate right foraminal stenosis at C5-6. 7. Mild to moderate central canal stenosis at C2-3 and C3-4.   Electronically Signed   By: Marin Roberts M.D.   On: 09/30/2014 11:03   US Carotid Bilateral  09/29/2014   ADDENDUM REPORT: 09/29/2014 20:09  ADDENDUM: Further clarification of the findings above. For the right internal carotid artery which demonstrated a peak systolic velocity of 145 cm/sec. While this number technically falls within range of a 50-70 % stenosis based on PSV alone, the remaining measurements including end diastolic volume, ICA/CCA PSV ratio, and objective appearance are most consistent with a mild/less than 50% stenosis.   Electronically Signed   By: Rise Mu M.D.   On: 09/29/2014 20:09   09/29/2014   CLINICAL DATA:  Initial evaluation for transient ischemic attack.  EXAM: BILATERAL CAROTID DUPLEX ULTRASOUND  TECHNIQUE: Wallace Cullens scale imaging, color Doppler and duplex ultrasound were performed of bilateral carotid and vertebral arteries in the neck.  COMPARISON:  None available.  FINDINGS: Criteria: Quantification of carotid stenosis is based on velocity parameters that correlate the residual internal carotid diameter with NASCET-based stenosis levels, using the diameter of the distal internal carotid lumen as the denominator for stenosis measurement.  The following velocity measurements were obtained:  RIGHT  ICA:  145 cm/sec  CCA:  119 cm/sec  SYSTOLIC ICA/CCA RATIO:  1.2  DIASTOLIC ICA/CCA RATIO:  1.5  ECA:  118 cm/sec  LEFT  ICA:  127 cm/sec  CCA:  84 cm/sec  SYSTOLIC ICA/CCA RATIO:  1.5  DIASTOLIC ICA/CCA RATIO:  1.5  ECA:  153 cm/sec   RIGHT CAROTID ARTERY: Antegrade flow. Small amount of plaque present at the right carotid bifurcation with less than 50% stenosis by NASCET criteria.  RIGHT VERTEBRAL ARTERY:  Antegrade flow  LEFT CAROTID ARTERY: Antegrade flow. Small amount of echogenic plaque present at the left carotid bifurcation  with less than 50% stenosis by NASCET criteria  LEFT VERTEBRAL ARTERY:  Antegrade flow  IMPRESSION: Mild atheromatous plaque about the bilateral carotid bifurcations without evidence for rate limiting stenosis (less than 50% by NASCET criteria).  Electronically Signed: By: Rise MuBenjamin  McClintock M.D. On: 09/29/2014 20:01    EKG: Independently reviewed.  Assessment/Plan 1. Right sided weakness, slurred speech-suspected TIA, continue asa and plavix.  Neuro feels it may be sz- keppra 500mg  po bid   2. Htn accelerated - increase lisinopril 20mg  po daily  3. Synthroid- levothryroxin  4. Genella RifeGerd- continue omeprezole  Diet: soft  Fluids: ordered DVT Prophylaxis: ordered  Code Status: FULL  Family Communication: yes  Disposition Plan: Home  Time spent: 50 min

## 2014-09-30 NOTE — Care Management (Signed)
Admitted for sx concerning for cva.  Brain MRI is negative. 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   Presenting sx have resolved.    Has health insurance.  No issues accessing medical care, obtaining medications, maintaining housing, utilities and food.

## 2014-09-30 NOTE — Progress Notes (Signed)
Patient discharged to home. MRI negative for stroke. Patient given medication education and  stroke prevention education. Follow up MD apts made for patient, prescriptions given to patient. 2 IVs discontinued and removed, fluids stopped. Telemetry removed. Pt left via wheelchair with wife and volunteer.

## 2014-09-30 NOTE — Discharge Summary (Signed)
Gregory Weaver, 74 y.o., DOB 1941-05-04, MRN [id number]. AdmisNani Gassersion date: 09/29/2014 Discharge Date 09/30/2014 Primary MD Marisue IvanLINTHAVONG, KANHKA, MD Admitting Physician Marguarite ArbourJeffrey D Sparks, MD  Admission Diagnosis  Weakness [R53.1] Brain TIA [G45.9] Transient cerebral ischemia, unspecified transient cerebral ischemia type [G45.9]  Discharge Diagnosis   Principal Problem:   Brain TIA  Suspected Seizure  Past Medical History  Diagnosis Date  . TIA (transient ischemic attack)   . Hypertension   . Hypothyroid   . Cataract   . Anemia   . Arthritis     Past Surgical History  Procedure Laterality Date  . Cataract extraction        Hospital Course See H&P, Labs, Consult and Test reports for all details in brief, patient was admitted for  with recurrent right-sided numbness associated with expressive aphasia. Had an episode two days ago lasting 30 minutes. Had recurrent sx's  Yesterday lasting one hour. Currently back to baseline. Some HA. No fever. On ASA and Plavix.  Pt was seen by neurology who supsected seizure.  Pt underwent mri of brain which was neg, MRA and neck and brain results as above.  Pt also had c- mri which showed spinal stenosis , which patient reports he is aware of the diagnosis and states his neck dosen't bother him as much.  Pt was started on keppra by neuro, eeg is negative.  He was recommend to continue asa, plavix and f/u with his primary care md.      Consults  neurology Dr. Doran ClayZeilekman  Significant Tests:  See full reports for all details      Ct Head Wo Contrast  09/29/2014   CLINICAL DATA:  Slurred speech  EXAM: CT HEAD WITHOUT CONTRAST  TECHNIQUE: Contiguous axial images were obtained from the base of the skull through the vertex without intravenous contrast.  COMPARISON:  09/28/2014  FINDINGS: No evidence of parenchymal hemorrhage or extra-axial fluid collection. No mass lesion, mass effect, or midline shift.  No CT evidence of acute infarction.  Subcortical white  matter and periventricular small vessel ischemic changes. Mild intracranial atherosclerosis.  Age related atrophy.  No ventriculomegaly.  The visualized paranasal sinuses are essentially clear. The mastoid air cells are unopacified.  No evidence of calvarial fracture.  IMPRESSION: No evidence of acute intracranial abnormality.  No interval change from recent CT.  Atrophy with small vessel ischemic changes.   Electronically Signed   By: Charline BillsSriyesh  Krishnan M.D.   On: 09/29/2014 14:45   Mr Maxine GlennMra Head Wo Contrast  09/30/2014   CLINICAL DATA:  Stroke. Slurred speech with right-sided weakness and numbness  EXAM: MRI HEAD WITHOUT CONTRAST  MRA HEAD WITHOUT CONTRAST  TECHNIQUE: Multiplanar, multiecho pulse sequences of the brain and surrounding structures were obtained without intravenous contrast. Angiographic images of the head were obtained using MRA technique without contrast.  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 10 mm posterior slip of the dens causing spinal stenosis at the craniocervical junction. This is unchanged.  Pituitary normal in size. Generalized atrophy with cerebral volume loss and mild ventricular enlargement  Negative for acute infarct  Chronic microvascular ischemic changes in the white matter are mild. 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  Negative for intracranial hemorrhage  Negative for mass or edema.  Mucosal edema in the paranasal sinuses, Mild  MRA HEAD FINDINGS  Both vertebral arteries are patent to the basilar. Basilar  widely patent. Superior cerebellar and posterior cerebral arteries are patent. Prominent left AICA patent  Right internal carotid artery has mild atherosclerotic disease. Right anterior cerebral artery widely patent. Focal moderate stenosis right M1 segment. Right middle cerebral artery branches are patent.  Left cavernous carotid widely  patent. Left anterior and left middle cerebral arteries widely patent  Negative for cerebral aneurysm.  IMPRESSION: Mild chronic ischemic change.  No acute infarct or mass  Moderate stenosis right M1 segment.   Electronically Signed   By: Marlan Palau M.D.   On: 09/30/2014 11:13   Mr Brain Wo Contrast  09/30/2014   CLINICAL DATA:  Stroke. Slurred speech with right-sided weakness and numbness  EXAM: MRI HEAD WITHOUT CONTRAST  MRA HEAD WITHOUT CONTRAST  TECHNIQUE: Multiplanar, multiecho pulse sequences of the brain and surrounding structures were obtained without intravenous contrast. Angiographic images of the head were obtained using MRA technique without contrast.  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 10 mm posterior slip of the dens causing spinal stenosis at the craniocervical junction. This is unchanged.  Pituitary normal in size. Generalized atrophy with cerebral volume loss and mild ventricular enlargement  Negative for acute infarct  Chronic microvascular ischemic changes in the white matter are mild. 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  Negative for intracranial hemorrhage  Negative for mass or edema.  Mucosal edema in the paranasal sinuses, Mild  MRA HEAD FINDINGS  Both vertebral arteries are patent to the basilar. Basilar widely patent. Superior cerebellar and posterior cerebral arteries are patent. Prominent left AICA patent  Right internal carotid artery has mild atherosclerotic disease. Right anterior cerebral artery widely patent. Focal moderate stenosis right M1 segment. Right middle cerebral artery branches are patent.  Left cavernous carotid widely patent. Left anterior and left middle cerebral arteries widely patent  Negative for cerebral aneurysm.  IMPRESSION: Mild chronic ischemic change.  No acute infarct or mass  Moderate stenosis right M1 segment.    Electronically Signed   By: Marlan Palau M.D.   On: 09/30/2014 11:13   Mr Cervical Spine Wo Contrast  09/30/2014   CLINICAL DATA:  Slurred speech.  Right-sided weakness and numbness.  EXAM: MRI CERVICAL SPINE WITHOUT CONTRAST  TECHNIQUE: Multiplanar, multisequence MR imaging of the cervical spine was performed. No intravenous contrast was administered.  COMPARISON:  MRI of the brain 03/31/2014.  FINDINGS: A chronic type 2 dens fracture is present with 7 mm of posterior displacement of the C1 arch in dens relative to the remainder of the CT vertebral body. There is moderate associated central canal stenosis at the C1-2 level with focal cord signal abnormality expanding extending cephalo caudad approximately 2 cm. No other focal cord signal abnormality is present. The visualized intracranial contents are normal.  There is fusion across the disc space at C5-6. Schmorl's nodes are present at C6-7. There is reversal of the normal cervical lordosis.  Flow is present in the vertebral arteries which appear to be codominant.  C2-3: Moderate right-sided facet hypertrophy contributes to mild right foraminal narrowing. The central disc protrusion effaces the ventral CSF. The left foramen is patent.  C3-4: A broad-based disc osteophyte complex partially effaces the ventral CSF. Mild to moderate right foraminal stenosis is due to uncovertebral and facet hypertrophy.  C4-5: A shallow central disc protrusion is present. Mild foraminal narrowing is evident bilaterally secondary to uncovertebral and facet hypertrophy.  C5-6: Asymmetric right-sided uncovertebral spurring results in moderate right foraminal stenosis. The left foramen is patent.  C6-7:  Slight disc bulging is present without significant stenosis.  C7-T1:  Negative.  IMPRESSION: 1. 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 2. Moderate to severe central canal stenosis at  the C1-2 level with cord signal changes extending 2 cm suggesting edema within the cord. 3. Mild right foraminal narrowing at C2-3. 4. Mild to moderate right foraminal stenosis at C3-4. 5. Mild foraminal narrowing bilaterally at C4-5. 6. Moderate right foraminal stenosis at C5-6. 7. Mild to moderate central canal stenosis at C2-3 and C3-4.   Electronically Signed   By: Marin Roberts M.D.   On: 09/30/2014 11:03   US Carotid Bilateral  09/29/2014   ADDENDUM REPORT: 09/29/2014 20:09  ADDENDUM: Further clarification of the findings above. For the right internal carotid artery which demonstrated a peak systolic velocity of 145 cm/sec. While this number technically falls within range of a 50-70 % stenosis based on PSV alone, the remaining measurements including end diastolic volume, ICA/CCA PSV ratio, and objective appearance are most consistent with a mild/less than 50% stenosis.   Electronically Signed   By: Rise Mu M.D.   On: 09/29/2014 20:09   09/29/2014   CLINICAL DATA:  Initial evaluation for transient ischemic attack.  EXAM: BILATERAL CAROTID DUPLEX ULTRASOUND  TECHNIQUE: Wallace Cullens scale imaging, color Doppler and duplex ultrasound were performed of bilateral carotid and vertebral arteries in the neck.  COMPARISON:  None available.  FINDINGS: Criteria: Quantification of carotid stenosis is based on velocity parameters that correlate the residual internal carotid diameter with NASCET-based stenosis levels, using the diameter of the distal internal carotid lumen as the denominator for stenosis measurement.  The following velocity measurements were obtained:  RIGHT  ICA:  145 cm/sec  CCA:  119 cm/sec  SYSTOLIC ICA/CCA RATIO:  1.2  DIASTOLIC ICA/CCA RATIO:  1.5  ECA:  118 cm/sec  LEFT  ICA:  127 cm/sec  CCA:  84 cm/sec  SYSTOLIC ICA/CCA RATIO:  1.5  DIASTOLIC ICA/CCA RATIO:  1.5  ECA:  153 cm/sec  RIGHT CAROTID ARTERY: Antegrade flow. Small amount of plaque present at the right carotid bifurcation with  less than 50% stenosis by NASCET criteria.  RIGHT VERTEBRAL ARTERY:  Antegrade flow  LEFT CAROTID ARTERY: Antegrade flow. Small amount of echogenic plaque present at the left carotid bifurcation with less than 50% stenosis by NASCET criteria  LEFT VERTEBRAL ARTERY:  Antegrade flow  IMPRESSION: Mild atheromatous plaque about the bilateral carotid bifurcations without evidence for rate limiting stenosis (less than 50% by NASCET criteria).  Electronically Signed: By: Rise Mu M.D. On: 09/29/2014 20:01   Ct Head Limited W/o Cm  09/28/2014   CLINICAL DATA:  Speech difficulty.  History of TIAs.  EXAM: CT HEAD WITHOUT CONTRAST  TECHNIQUE: Contiguous axial images were obtained from the base of the skull through the vertex without intravenous contrast.  COMPARISON:  04/18/2014; 04/05/2014; 06/05/2010  FINDINGS: Similar findings of atrophy with sulcal prominence and centralized volume loss with commensurate ex vacuo dilatation of the ventricular system. Scattered periventricular hypodensities compatible with microvascular ischemic disease. Bilateral basal ganglial calcifications, right greater than left. No CT evidence of superimposed acute large territory infarct. No intraparenchymal or extra-axial mass or hemorrhage. Unchanged size and configuration of the ventricles and basilar cisterns. No midline shift. Intracranial atherosclerosis.  There is under pneumatization of the right frontal sinus. The remaining paranasal sinuses and mastoid air cells are normally aerated. No air-fluid levels. Regional soft tissues appear normal. No displaced calvarial fracture.  IMPRESSION: Similar findings of atrophy and microvascular ischemic disease without acute intracranial process.   Electronically Signed   By: Simonne Come M.D.   On: 09/28/2014 12:29       Today   Subjective:   Gregory Weaver today has no headache,no chest abdominal pain,no new weakness tingling or numbness, feels much better wants to go home  today.    Objective:   Blood pressure 172/59, pulse 63, temperature 97.7 F (36.5 C), temperature source Oral, resp. rate 18, height 5\' 7"  (1.702 m), weight 69.945 kg (154 lb 3.2 oz), SpO2 100 %.  .  Intake/Output Summary (Last 24 hours) at 09/30/14 1412 Last data filed at 09/30/14 1406  Gross per 24 hour  Intake    240 ml  Output    100 ml  Net    140 ml    Exam Awake Alert, Oriented *3, No new F.N deficits, Normal affect [place].AT,PERRAL Supple Neck,No JVD, No cervical lymphadenopathy appreciated.  Symmetrical Chest wall movement, Good air movement bilaterally, CTAB RRR,No Gallops,Rubs or new Murmurs, No Parasternal Heave +ve B.Sounds, Abd Soft, Non tender, No organomegaly appriciated, No rebound -guarding or rigidity. No Cyanosis, Clubbing or edema, No new Rash or bruise  Data Review   Cultures -   CBC w Diff: Lab Results  Component Value Date   WBC 5.9 09/30/2014   WBC 6.5 09/28/2014   HGB 10.9* 09/30/2014   HGB 10.8* 09/28/2014   HCT 32.2* 09/30/2014   HCT 32.3* 09/28/2014   PLT 214 09/30/2014   PLT 199 09/28/2014   LYMPHOPCT 19% 09/29/2014   LYMPHOPCT 14.7 09/28/2014   MONOPCT 7% 09/29/2014   MONOPCT 8.9 09/28/2014   EOSPCT 2% 09/29/2014   EOSPCT 1.8 09/28/2014   BASOPCT 1% 09/29/2014   BASOPCT 1.3 09/28/2014   CMP: Lab Results  Component Value Date   NA 139 09/30/2014   NA 137 09/28/2014   K 3.4* 09/30/2014   K 3.4* 09/28/2014   CL 105 09/30/2014   CL 104 09/28/2014   CO2 25 09/30/2014   CO2 26 09/28/2014   BUN 16 09/30/2014   BUN 21* 09/28/2014   CREATININE 0.86 09/30/2014   CREATININE 1.02 09/28/2014   PROT 7.1 09/29/2014   PROT 6.7 09/28/2014   ALBUMIN 4.0 09/29/2014   ALBUMIN 3.8 09/28/2014   BILITOT 0.5 09/29/2014   ALKPHOS 72 09/29/2014   ALKPHOS 66 09/28/2014   AST 21 09/29/2014   AST 20 09/28/2014   ALT 17 09/29/2014   ALT 15* 09/28/2014  .  Micro Results No results found for this or any previous visit (from the past 240  hour(s)).   Discharge Instructions        Follow-up Information    Follow up with [facility], Durene Cal, MD.   Specialty:  Neurology   Contact information:   [phone] [facility] [address] [facility] [place] [address] [phone]       Follow up with Marisue Ivan, MD In 7 days.   Specialty:  Family Medicine   Contact information:   [address] [facility] [place] [address] [phone]       Discharge Medications     Medication List    TAKE these medications        acetaminophen 500 MG tablet  Commonly known  as:  TYLENOL  Take 1,000 mg by mouth every 6 (six) hours as needed for mild pain.     aspirin EC 325 MG tablet  Take 325 mg by mouth daily.     atorvastatin 10 MG tablet  Commonly known as:  LIPITOR  Take 0.5 tablets (5 mg total) by mouth daily at 6 PM.     Cholecalciferol 1000 UNITS tablet  Take 1,000 Units by mouth daily.     clopidogrel 75 MG tablet  Commonly known as:  PLAVIX  Take 75 mg by mouth daily.     ferrous sulfate 325 (65 FE) MG tablet  Take 325 mg by mouth daily with lunch.     fluticasone 50 MCG/ACT nasal spray  Commonly known as:  FLONASE  Place 2 sprays into both nostrils daily.     folic acid 1 MG tablet  Commonly known as:  FOLVITE  Take 2 mg by mouth daily with supper.     levETIRAcetam 500 MG tablet  Commonly known as:  KEPPRA  Take 1 tablet (500 mg total) by mouth 2 (two) times daily.     levothyroxine 137 MCG tablet  Commonly known as:  SYNTHROID, LEVOTHROID  Take 137 mcg by mouth daily before breakfast.     lisinopril 20 MG tablet  Commonly known as:  PRINIVIL,ZESTRIL  Take 1 tablet (20 mg total) by mouth daily.     methotrexate 2.5 MG tablet  Commonly known as:  RHEUMATREX  - Take 10 mg by mouth once a week. Caution:Chemotherapy. Protect from light.  - Every Thursday     omeprazole 20 MG capsule  Commonly known as:  PRILOSEC  Take 20 mg by mouth daily.     PRESERVISION  AREDS Caps  Take 1 capsule by mouth daily.     SALMON OIL-1000 PO  Take 2 capsules by mouth 2 (two) times daily.     vitamin B-12 1000 MCG tablet  Commonly known as:  CYANOCOBALAMIN  Take 1,000 mcg by mouth daily.         Total Time in preparing paper work, data evaluation and todays exam - 35 minutes  Auburn Bilberry M.D on 09/30/2014 at 2:12 PM  [facility]   Office  [phone]

## 2014-09-30 NOTE — Discharge Instructions (Signed)
Follow with Primary MD Marisue IvanLINTHAVONG, KANHKA, MD in 7 days      Activity: As tolerated with Full fall precautions use walker/cane & assistance as needed   Disposition Home    Diet: Heart Healthy  For Heart failure patients - Check your Weight same time everyday, if you gain over 2 pounds, or you develop in leg swelling, experience more shortness of breath or chest pain, call your Primary MD immediately. Follow Cardiac Low Salt Diet and 1.5 lit/day fluid restriction.   On your next visit with your primary care physician please Get Medicines reviewed and adjusted.   Please request your Prim.MD to go over all Hospital Tests and Procedure/Radiological results at the follow up, please get all Hospital records sent to your Prim MD by signing hospital release before you go home.   If you experience worsening of your admission symptoms, develop shortness of breath, life threatening emergency, suicidal or homicidal thoughts you must seek medical attention immediately by calling 911 or calling your MD immediately  if symptoms less severe.  You Must read complete instructions/literature along with all the possible adverse reactions/side effects for all the Medicines you take and that have been prescribed to you. Take any new Medicines after you have completely understood and accpet all the possible adverse reactions/side effects.   Do not drive, operating heavy machinery, perform activities at heights, swimming or participation in water activities or provide baby sitting services if your were admitted for syncope or siezures until you have seen by Primary MD or a Neurologist and advised to do so again.  Do not drive when taking Pain medications.    Do not take more than prescribed Pain, Sleep and Anxiety Medications  Special Instructions: If you have smoked or chewed Tobacco  in the last 2 yrs please stop smoking, stop any regular Alcohol  and or any Recreational drug use.  Wear Seat belts while  driving.   Please note  You were cared for by a hospitalist during your hospital stay. If you have any questions about your discharge medications or the care you received while you were in the hospital after you are discharged, you can call the unit and asked to speak with the hospitalist on call if the hospitalist that took care of you is not available. Once you are discharged, your primary care physician will handle any further medical issues. Please note that NO REFILLS for any discharge medications will be authorized once you are discharged, as it is imperative that you return to your primary care physician (or establish a relationship with a primary care physician if you do not have one) for your aftercare needs so that they can reassess your need for medications and monitor your lab values.

## 2014-10-09 ENCOUNTER — Other Ambulatory Visit: Payer: Self-pay

## 2014-10-09 ENCOUNTER — Emergency Department
Admission: EM | Admit: 2014-10-09 | Discharge: 2014-10-09 | Disposition: A | Discharge status: Home / Self Care | Payer: 59 | Attending: Emergency Medicine | Admitting: Emergency Medicine

## 2014-10-09 ENCOUNTER — Emergency Department: Payer: 59

## 2014-10-09 DIAGNOSIS — Z7902 Long term (current) use of antithrombotics/antiplatelets: Secondary | ICD-10-CM | POA: Diagnosis not present

## 2014-10-09 DIAGNOSIS — I10 Essential (primary) hypertension: Secondary | ICD-10-CM | POA: Diagnosis not present

## 2014-10-09 DIAGNOSIS — Z7982 Long term (current) use of aspirin: Secondary | ICD-10-CM | POA: Insufficient documentation

## 2014-10-09 DIAGNOSIS — Z79899 Other long term (current) drug therapy: Secondary | ICD-10-CM | POA: Diagnosis not present

## 2014-10-09 DIAGNOSIS — Z87891 Personal history of nicotine dependence: Secondary | ICD-10-CM | POA: Insufficient documentation

## 2014-10-09 DIAGNOSIS — G40209 Localization-related (focal) (partial) symptomatic epilepsy and epileptic syndromes with complex partial seizures, not intractable, without status epilepticus: Secondary | ICD-10-CM | POA: Insufficient documentation

## 2014-10-09 DIAGNOSIS — R4701 Aphasia: Secondary | ICD-10-CM | POA: Diagnosis present

## 2014-10-09 HISTORY — DX: Unspecified convulsions: R56.9

## 2014-10-09 LAB — COMPREHENSIVE METABOLIC PANEL
ALT: 14 U/L — AB (ref 17–63)
AST: 18 U/L (ref 15–41)
Albumin: 4.3 g/dL (ref 3.5–5.0)
Alkaline Phosphatase: 73 U/L (ref 38–126)
Anion gap: 9 (ref 5–15)
BUN: 17 mg/dL (ref 6–20)
CALCIUM: 9 mg/dL (ref 8.9–10.3)
CO2: 27 mmol/L (ref 22–32)
Chloride: 102 mmol/L (ref 101–111)
Creatinine, Ser: 1.09 mg/dL (ref 0.61–1.24)
GFR calc Af Amer: >60 mL/min (ref >60)
GFR calc non Af Amer: >60 mL/min (ref >60)
Glucose, Bld: 123 mg/dL — ABNORMAL HIGH (ref 65–99)
Potassium: 3.5 mmol/L (ref 3.5–5.1)
SODIUM: 138 mmol/L (ref 135–145)
TOTAL PROTEIN: 7.4 g/dL (ref 6.5–8.1)
Total Bilirubin: 0.4 mg/dL (ref 0.3–1.2)

## 2014-10-09 LAB — CBC WITH DIFFERENTIAL/PLATELET
Basophils Absolute: 0.1 10*3/uL (ref 0–0.1)
Basophils Relative: 2 %
EOS PCT: 2 %
Eosinophils Absolute: 0.2 10*3/uL (ref 0–0.7)
HCT: 35 % — ABNORMAL LOW (ref 40.0–52.0)
HEMOGLOBIN: 11.6 g/dL — AB (ref 13.0–18.0)
LYMPHS PCT: 15 %
Lymphs Abs: 1.1 10*3/uL (ref 1.0–3.6)
MCH: 29.2 pg (ref 26.0–34.0)
MCHC: 33.3 g/dL (ref 32.0–36.0)
MCV: 87.9 fL (ref 80.0–100.0)
MONO ABS: 0.5 10*3/uL (ref 0.2–1.0)
Monocytes Relative: 7 %
Neutro Abs: 5.3 10*3/uL (ref 1.4–6.5)
Neutrophils Relative %: 74 %
PLATELETS: 248 10*3/uL (ref 150–440)
RBC: 3.98 MIL/uL — ABNORMAL LOW (ref 4.40–5.90)
RDW: 14.8 % — ABNORMAL HIGH (ref 11.5–14.5)
WBC: 7.1 10*3/uL (ref 3.8–10.6)

## 2014-10-09 LAB — ETHANOL

## 2014-10-09 LAB — TROPONIN I: Troponin I: <0.03 ng/mL (ref <0.031)

## 2014-10-09 MED ORDER — ATIVAN 1 MG PO TABS: 1.0000 mg | ORAL_TABLET | Freq: Two times a day (BID) | ORAL | Status: AC

## 2014-10-09 NOTE — ED Provider Notes (Signed)
[facility]  ____________________________________________  Time seen: Approximately 5:56 PM  I have reviewed the triage vital signs and the nursing notes.   HISTORY  Chief Complaint Aphasia   HPI Gregory Weaver is a 74 y.o. male who presents with his caregiver. They report he has recent has recently been diagnosed with partial complex seizures and had another one of his partial complex seizures today they called Dr. Sherryll BurgerShah who wanted him to come to the emergency room and get a CAT scan to be make sure he did not have any any hemorrhage and seizures, on with slurry speech bilateral arm numbness very high blood pressure that develops patient had his lamotrigine increased to 25 mg one twice a day yesterday from once a day and had his lisinopril increased to 20 mg yesterday patient's symptoms have resolved completely during his 4 hour wait in the waiting room CT scan showed no hemorrhage labs are normal and again patient is back to baseline. They report Dr. Clelia CroftShaw had wanted to be called slippage Dr. Clelia CroftShaw discussed the case with him he wants me to remind them to increase their lamotrigine to 50 twice a day and a week and to give them a prescription for Ativan 1 mg 10 pills to take 1 if he feels the seizure coming on   Past Medical History  Diagnosis Date  . TIA (transient ischemic attack)   . Hypertension   . Hypothyroid   . Cataract   . Anemia   . Arthritis   . Seizures     Patient Active Problem List   Diagnosis Date Noted  . 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 Stroke   . GERD (gastroesophageal reflux disease) 04/01/2014  . Hypothyroidism 04/01/2014  . Essential hypertension 04/01/2014  . TIA (transient ischemic attack) 03/30/2014    Past Surgical History  Procedure Laterality Date  . Cataract extraction    . Back surgery      Current Outpatient Rx  Name  Route  Sig  Dispense  Refill  . lamoTRIgine (LAMICTAL) 25 MG tablet   Oral   Take 25 mg by mouth 2 (two)  times daily.         Marland Kitchen. acetaminophen (TYLENOL) 500 MG tablet   Oral   Take 1,000 mg by mouth every 6 (six) hours as needed for mild pain.          Marland Kitchen. aspirin EC 325 MG tablet   Oral   Take 325 mg by mouth daily.         Marland Kitchen. atorvastatin (LIPITOR) 10 MG tablet   Oral   Take 0.5 tablets (5 mg total) by mouth daily at 6 PM. Patient taking differently: Take 20 mg by mouth daily with supper.    30 tablet   0   . Cholecalciferol 1000 UNITS tablet   Oral   Take 1,000 Units by mouth daily.         . clopidogrel (PLAVIX) 75 MG tablet   Oral   Take 75 mg by mouth daily.         . ferrous sulfate 325 (65 FE) MG tablet   Oral   Take 325 mg by mouth daily with lunch.         . fluticasone (FLONASE) 50 MCG/ACT nasal spray   Each Nare   Place 2 sprays into both nostrils daily.          . folic acid (FOLVITE) 1 MG tablet   Oral   Take  2 mg by mouth daily with supper.          . levETIRAcetam (KEPPRA) 500 MG tablet   Oral   Take 1 tablet (500 mg total) by mouth 2 (two) times daily.   60 tablet   2   . levothyroxine (SYNTHROID, LEVOTHROID) 137 MCG tablet   Oral   Take 137 mcg by mouth daily before breakfast.         . lisinopril (PRINIVIL,ZESTRIL) 20 MG tablet   Oral   Take 1 tablet (20 mg total) by mouth daily.   30 tablet   1   . methotrexate (RHEUMATREX) 2.5 MG tablet   Oral   Take 10 mg by mouth once a week. Caution:Chemotherapy. Protect from light. Every Thursday         . Multiple Vitamins-Minerals (PRESERVISION AREDS) CAPS   Oral   Take 1 capsule by mouth daily.         . Omega-3 Fatty Acids (SALMON OIL-1000 PO)   Oral   Take 2 capsules by mouth 2 (two) times daily.         Marland Kitchen omeprazole (PRILOSEC) 20 MG capsule   Oral   Take 20 mg by mouth daily.         . vitamin B-12 (CYANOCOBALAMIN) 1000 MCG tablet   Oral   Take 1,000 mcg by mouth daily.           Allergies Codeine and Guaifenesin & derivatives  No family history on  file.  Social History History  Substance Use Topics  . Smoking status: Former Games developer  . Smokeless tobacco: Never Used  . Alcohol Use: No    Review of Systems Constitutional: No fever/chills Eyes: No visual changes. ENT: No sore throat. Cardiovascular: Denies chest pain. Respiratory: Denies shortness of breath. Gastrointestinal: No abdominal pain.  No nausea, no vomiting.  No diarrhea.  No constipation. Genitourinary: Negative for dysuria. Musculoskeletal: Negative for back pain. Skin: Negative for rash. Neuro exam cranial nerves II through XII are intact motor is 5 over 5 throughout sensation is normal throughout cerebellar finger-nose is normal again patient's back at baseline 10-point ROS otherwise negative.  ____________________________________________   PHYSICAL EXAM:  VITAL SIGNS: ED Triage Vitals  Enc Vitals Group     BP 10/09/14 1345 190/57 mmHg     Pulse Rate 10/09/14 1345 73     Resp 10/09/14 1345 18     Temp 10/09/14 1345 98.4 F (36.9 C)     Temp Source 10/09/14 1345 Oral     SpO2 10/09/14 1345 98 %     Weight 10/09/14 1345 155 lb (70.308 kg)     Height 10/09/14 1345  (1.702 m)     Head Cir --      Peak Flow --      Pain Score --      Pain Loc --      Pain Edu? --      Excl. in GC? --    Constitutional: Alert and oriented. Well appearing and in no acute distress. Eyes: Conjunctivae are normal. PERRL. EOMI. Head: Atraumatic. Nose: No congestion/rhinnorhea. Mouth/Throat: Mucous membranes are moist.  Oropharynx non-erythematous. Neck: No stridor.   }Cardiovascular: Normal rate, regular rhythm. Grossly normal heart sounds.  Good peripheral circulation. Respiratory: Normal respiratory effort.  No retractions. Lungs CTAB. Gastrointestinal: Soft and nontender. No distention. No abdominal bruits. No CVA tenderness. Musculoskeletal: No lower extremity tenderness nor edema.  No joint effusions. Neurologic:  Normal speech and language. No  gross focal  neurologic deficits are appreciated. Speech is normal. No gait instability. Skin:  Skin is warm, dry and intact. No rash noted. Psychiatric: Mood and affect are normal. Speech and behavior are normal.  ____________________________________________   LABS (all labs ordered are listed, but only abnormal results are displayed)  Labs Reviewed  COMPREHENSIVE METABOLIC PANEL - Abnormal; Notable for the following:    Glucose, Bld 123 (*)    ALT 14 (*)    All other components within normal limits  CBC WITH DIFFERENTIAL/PLATELET - Abnormal; Notable for the following:    RBC 3.98 (*)    Hemoglobin 11.6 (*)    HCT 35.0 (*)    RDW 14.8 (*)    All other components within normal limits  ETHANOL  TROPONIN I   ____________________________________________  EKG EKG shows normal sinus rhythm left axis left anterior hemiblock no acute changes noted. The UA U waves present in the 2 through 6 and also inferiorly in leads ____________________________________________  RADIOLOGY  EKG was reported as no acute changes no sign of hemorrhage I looked at the CT myself and agree ____________________________________________   PROCEDURES  Procedure(s) performed: None  Critical Care performed: No  ____________________________________________   INITIAL IMPRESSION / ASSESSMENT AND PLAN / ED COURSE  Pertinent labs & imaging results that were available during my care of the patient were reviewed by me and considered in my medical decision making (see chart for details).   ____________________________________________   FINAL CLINICAL IMPRESSION(S) / ED DIAGNOSES  Final diagnoses:  Partial symptomatic epilepsy with complex partial seizures, not intractable, without status epilepticus     Arnaldo NatalPaul F Keesha Pellum, MD 10/09/14 1801

## 2014-10-09 NOTE — ED Notes (Signed)
Family reports patient had episode around 1130 with numbness to left arm and hand and slurred speech.  Denies headache.  Pt was just recently dx with complex partial seizures.

## 2014-10-09 NOTE — Discharge Instructions (Signed)
Please follow up with your neurologist Dr Sherryll BurgerShah.   He wants you to take an ativan pill if you feel the seizure starting. You can take a second one an hour later if the seizure has not stopped (and you are able to do so) Do not take more than 2 in an hour. (be careful they can make you groggy-don't fall).   In a week increase the lamotrigine to 50 mg 2 x a day.  Return for any problems.

## 2014-10-30 DEATH — deceased

## 2017-05-05 IMAGING — CT CT HEAD W/O CM
1 series · 16 of 30 positions shown, 20 images · non-contrast
Comparison: CT head 09/29/2014

CLINICAL DATA: Aphasia.  Slurred speech

EXAM:
CT HEAD WITHOUT CONTRAST
TECHNIQUE: Contiguous axial images were obtained from the base of the skull
through the vertex without intravenous contrast.

[Series 2: head wo · axial · 0.44mm/px · z∈[+1261,+1405]mm · 16 of 36 slices shown, 20 images]
[im 2/36  brain]
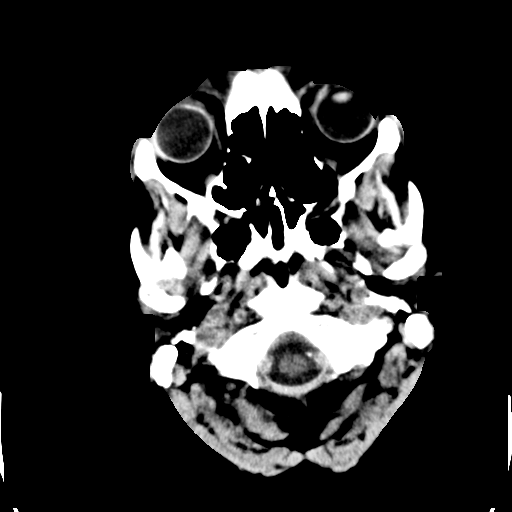
[im 2/36  bone]
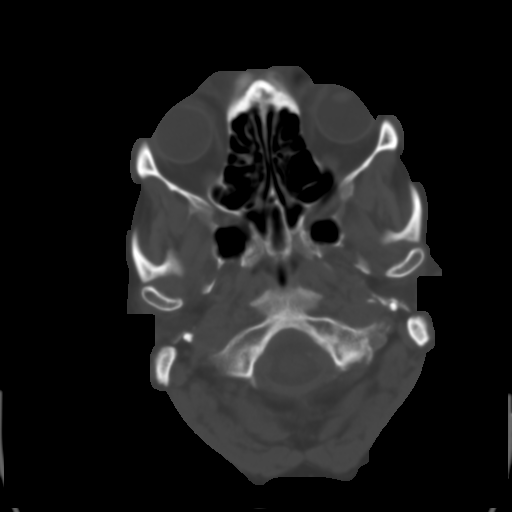
[im 4/36  brain]
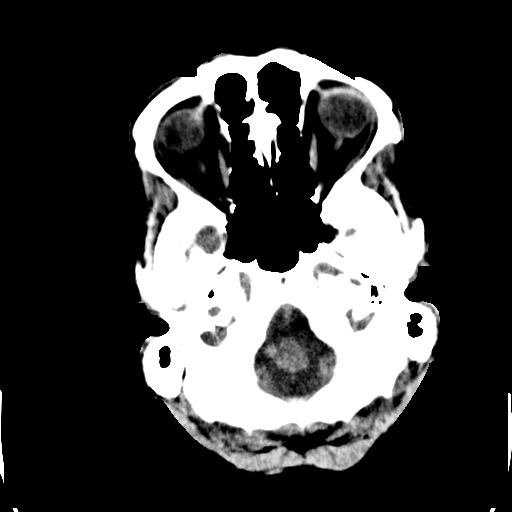
[im 7/36  brain]
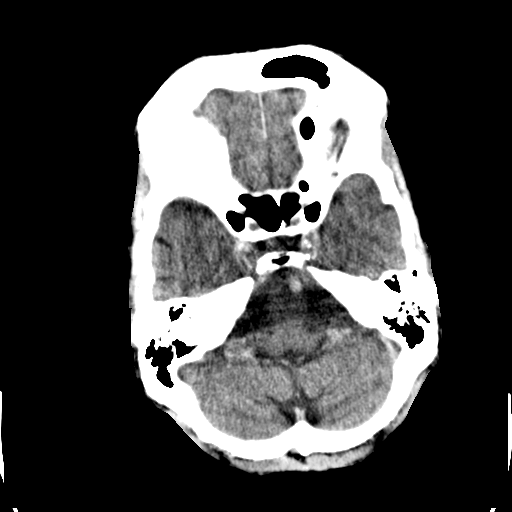
[im 9/36  brain]
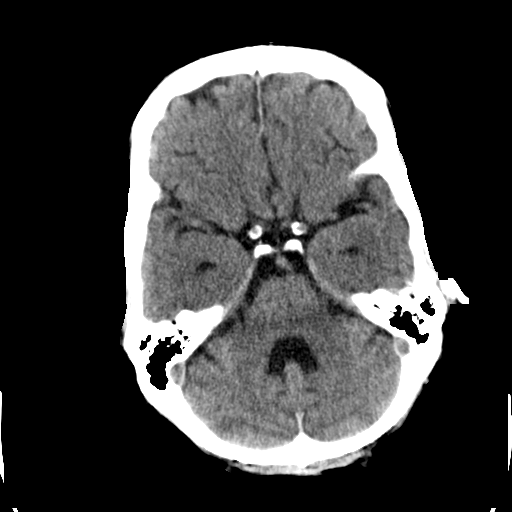
[im 10/36  brain]
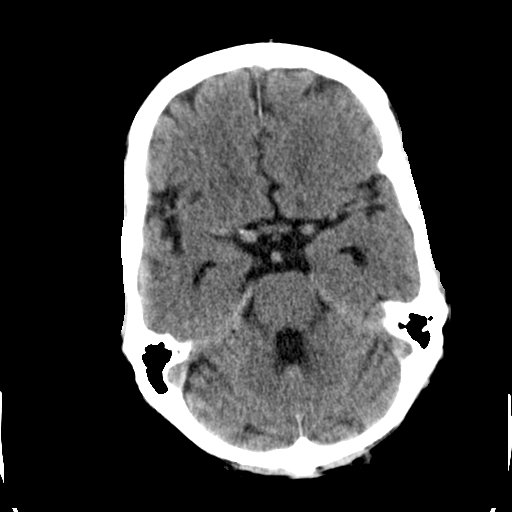
[im 10/36  bone]
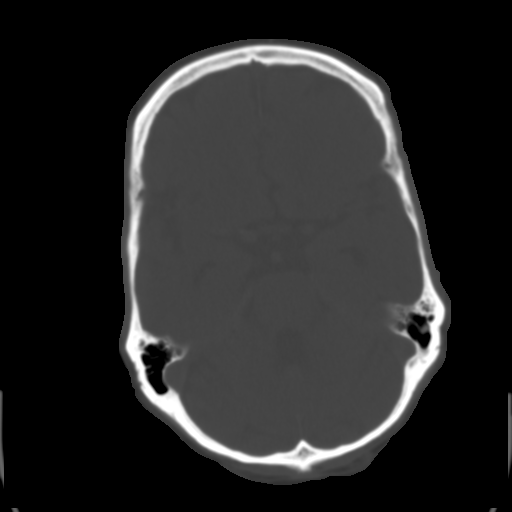
[im 13/36  brain]
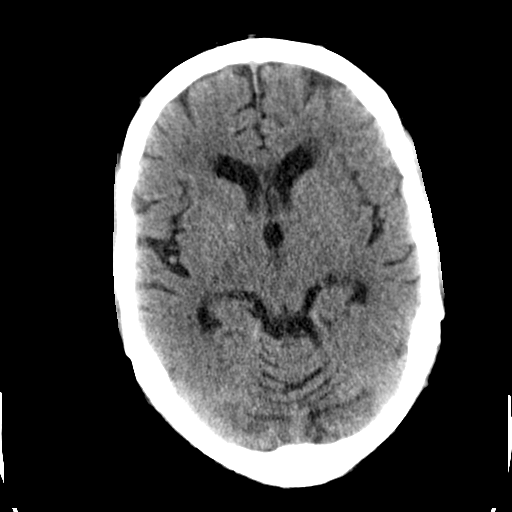
[im 15/36  brain]
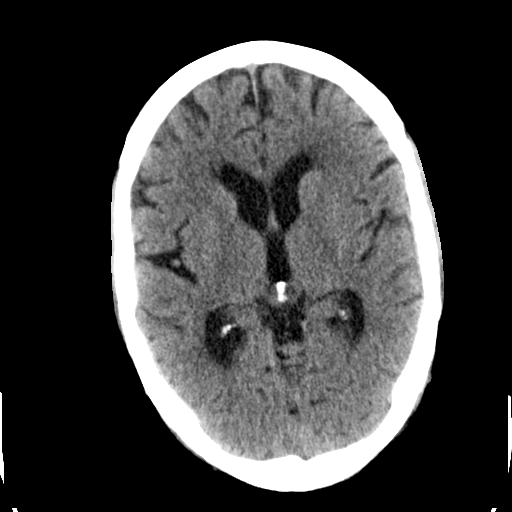
[im 17/36  brain]
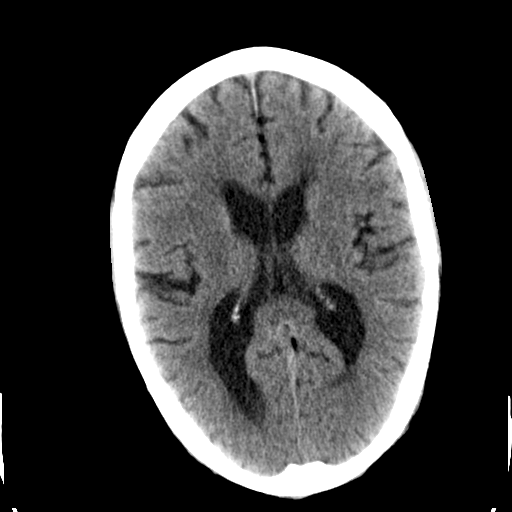
[im 19/36  brain]
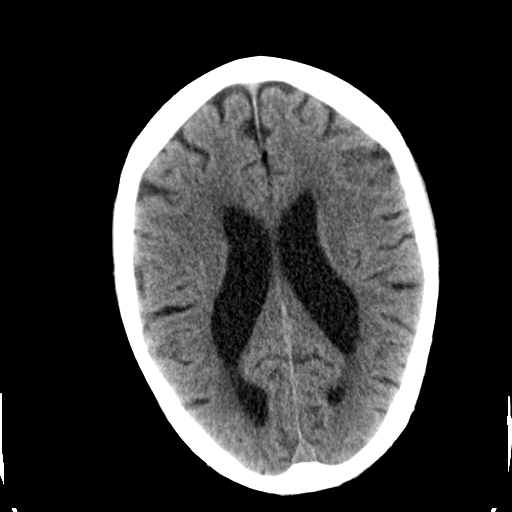
[im 19/36  bone]
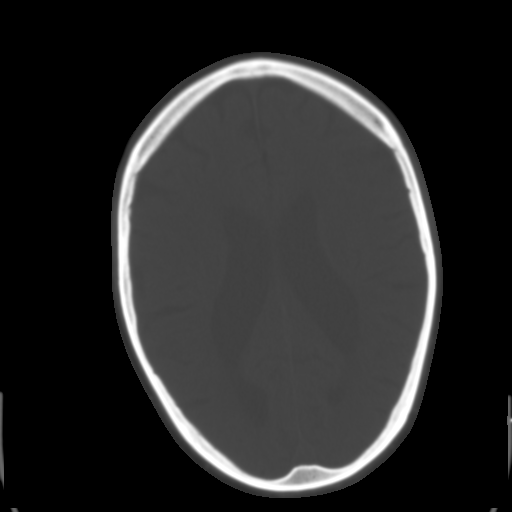
[im 21/36  brain]
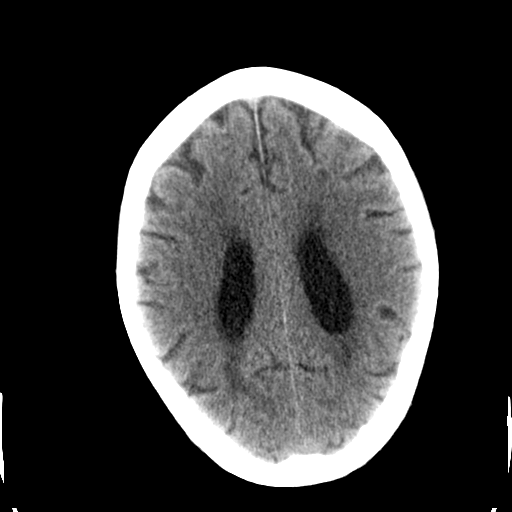
[im 23/36  brain]
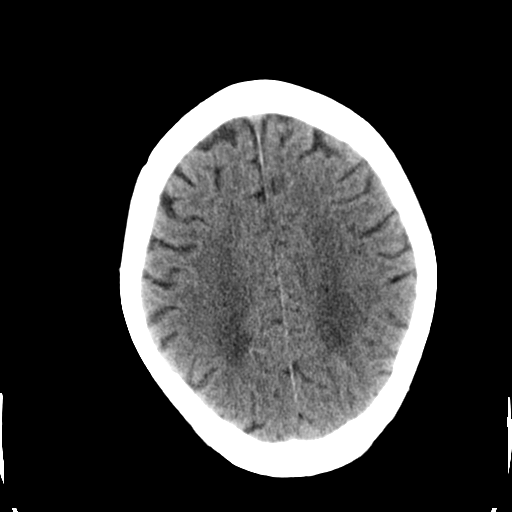
[im 26/36  brain]
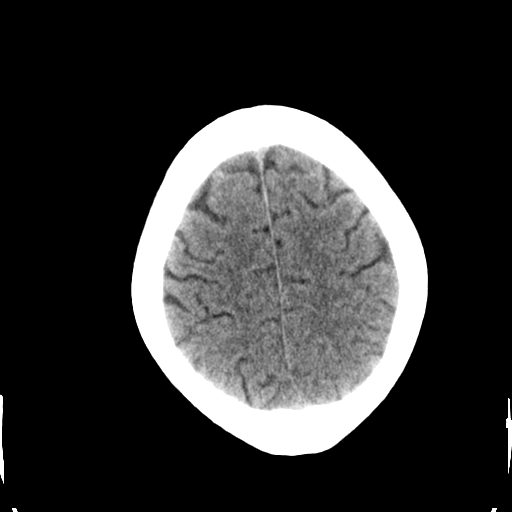
[im 27/36  brain]
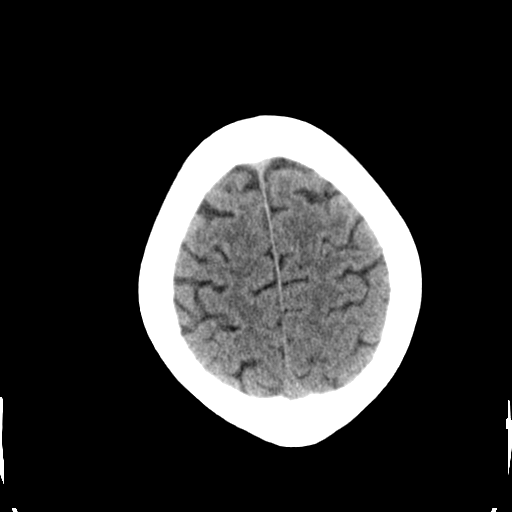
[im 27/36  bone]
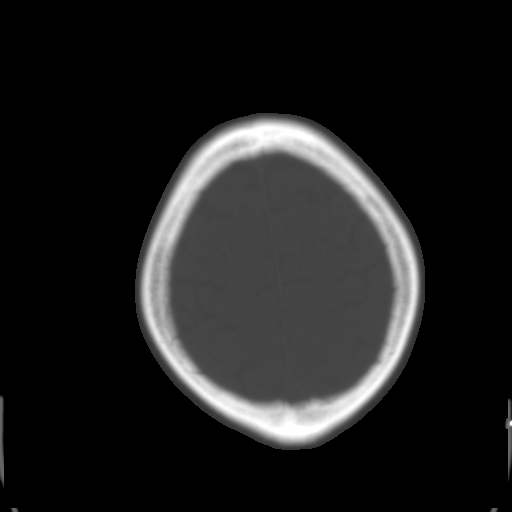
[im 29/36  brain]
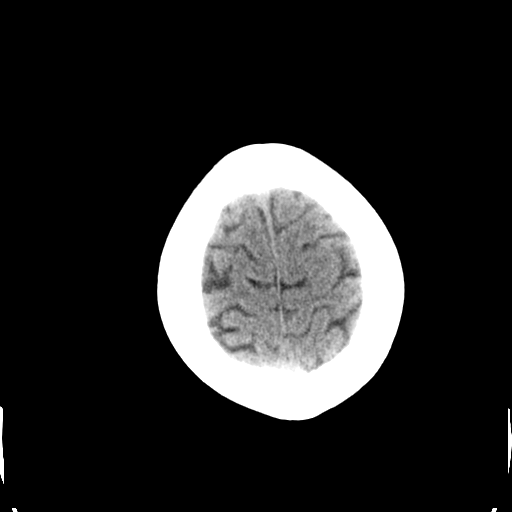
[im 32/36  brain]
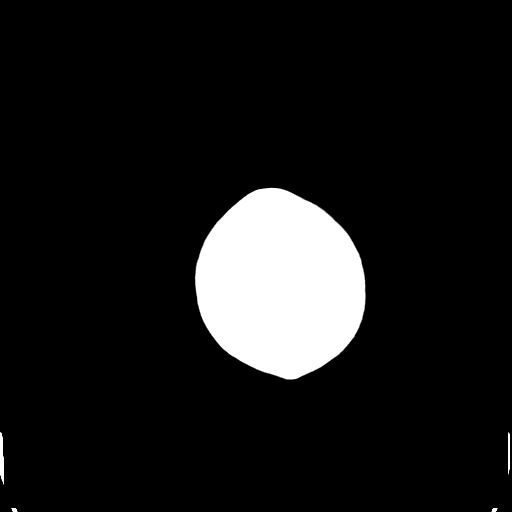
[im 34/36  brain]
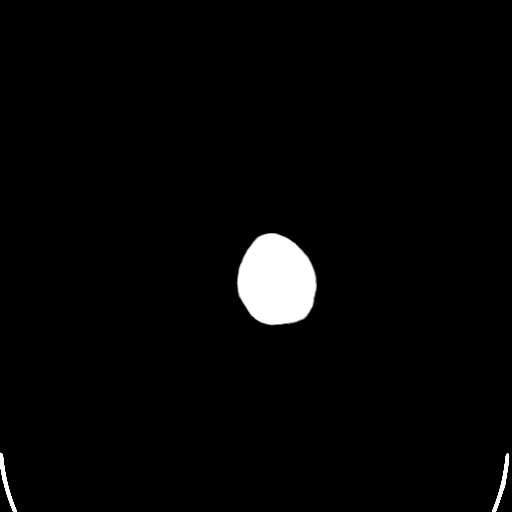

[16 of 30 positions shown; findings below may reference images not displayed]

FINDINGS: Generalized atrophy. Mild chronic microvascular ischemic change in
the white matter.

Negative for acute infarct. Negative for hemorrhage or mass. No
change from the recent CT
IMPRESSION: Atrophy and chronic microvascular ischemia.  No acute abnormality.
